# Patient Record
Sex: Female | Born: 1985 | Race: White | Hispanic: No | Marital: Married | Smoking: Never smoker
Health system: Southern US, Community
[De-identification: ages and names within clinical notes are randomized; demographics above are authoritative.]

## PROBLEM LIST (undated history)

## (undated) ENCOUNTER — Inpatient Hospital Stay (HOSPITAL_COMMUNITY): Payer: Self-pay

## (undated) DIAGNOSIS — Z789 Other specified health status: Secondary | ICD-10-CM

## (undated) HISTORY — PX: NO PAST SURGERIES: SHX2092

---

## 2013-06-12 ENCOUNTER — Inpatient Hospital Stay (HOSPITAL_COMMUNITY)
Admission: AD | Admit: 2013-06-12 | Discharge: 2013-06-12 | Disposition: A | Discharge status: Home / Self Care | Payer: 59 | Source: Ambulatory Visit | Attending: Family Medicine | Admitting: Family Medicine

## 2013-06-12 ENCOUNTER — Encounter (HOSPITAL_COMMUNITY): Payer: Self-pay | Admitting: *Deleted

## 2013-06-12 ENCOUNTER — Inpatient Hospital Stay (HOSPITAL_COMMUNITY): Payer: 59

## 2013-06-12 DIAGNOSIS — O200 Threatened abortion: Secondary | ICD-10-CM

## 2013-06-12 HISTORY — DX: Other specified health status: Z78.9

## 2013-06-12 LAB — URINALYSIS, ROUTINE W REFLEX MICROSCOPIC
Bilirubin Urine: NEGATIVE
Ketones, ur: NEGATIVE mg/dL
Nitrite: NEGATIVE
Protein, ur: NEGATIVE mg/dL
Specific Gravity, Urine: 1.02 (ref 1.005–1.030)
Urobilinogen, UA: 0.2 mg/dL (ref 0.0–1.0)

## 2013-06-12 LAB — ABO/RH: ABO/RH(D): O POS

## 2013-06-12 LAB — CBC
HCT: 38.5 % (ref 36.0–46.0)
MCH: 29.6 pg (ref 26.0–34.0)
MCHC: 34 g/dL (ref 30.0–36.0)
MCV: 86.9 fL (ref 78.0–100.0)
Platelets: 222 10*3/uL (ref 150–400)
RDW: 12.3 % (ref 11.5–15.5)

## 2013-06-12 LAB — WET PREP, GENITAL
Clue Cells Wet Prep HPF POC: NONE SEEN
Trich, Wet Prep: NONE SEEN

## 2013-06-12 NOTE — MAU Note (Signed)
Heavy bleeding x1 day. Pt states she has been having bleeding for about 6 days

## 2013-06-12 NOTE — MAU Provider Note (Signed)
History     CSN: [id number]  Arrival date and time: 06/12/13 1711   None     Chief Complaint  Patient presents with  . Vaginal Bleeding   HPI  Meghan Mcmahon is a 27 y.o. G1P1001 who presents today with vaginal bleeding. She states that she took a home pregnancy test that was positive, and then started bleeding. She states that she has been spotting for a couple of weeks, but it became heavy today. Her LMP 05/06/13 and she had a positive pregnancy test 06/04/13.   She has just moved to the area, and has not established care anywhere.   No past medical history on file.  No past surgical history on file.  No family history on file.  History  Substance Use Topics  . Smoking status: Not on file  . Smokeless tobacco: Not on file  . Alcohol Use: Not on file    Allergies: No Known Allergies  Prescriptions prior to admission  Medication Sig Dispense Refill  . acetaminophen (TYLENOL) 500 MG tablet Take 1,000 mg by mouth every 6 (six) hours as needed for pain (stomach pain).        ROS Physical Exam   Blood pressure 132/52, pulse 124, temperature 98.8 F (37.1 C), temperature source Oral, resp. rate 18, height 5' (1.524 m), weight 46.539 kg (102 lb 9.6 oz).  Physical Exam  Nursing note and vitals reviewed. Constitutional: She is oriented to person, place, and time. She appears well-developed and well-nourished. No distress.  Cardiovascular: Normal rate.   Respiratory: Effort normal.  GI: Soft.  Genitourinary:   External: no lesion Vagina: moderate amount of blood and clot  Cervix: pink, smooth, no CMT Uterus: NSSC Adnexa: NT   Neurological: She is alert and oriented to person, place, and time.  Skin: Skin is warm and dry.  Psychiatric: She has a normal mood and affect.    MAU Course  Procedures  Results for orders placed during the hospital encounter of 06/12/13 (from the past 24 hour(s))  URINALYSIS, ROUTINE W REFLEX MICROSCOPIC     Status: Abnormal   Collection Time    06/12/13  5:30 PM      Result Value Range   Color, Urine YELLOW  YELLOW   APPearance CLEAR  CLEAR   Specific Gravity, Urine 1.020  1.005 - 1.030   pH 7.0  5.0 - 8.0   Glucose, UA NEGATIVE  NEGATIVE mg/dL   Hgb urine dipstick LARGE (*) NEGATIVE   Bilirubin Urine NEGATIVE  NEGATIVE   Ketones, ur NEGATIVE  NEGATIVE mg/dL   Protein, ur NEGATIVE  NEGATIVE mg/dL   Urobilinogen, UA 0.2  0.0 - 1.0 mg/dL   Nitrite NEGATIVE  NEGATIVE   Leukocytes, UA NEGATIVE  NEGATIVE  URINE MICROSCOPIC-ADD ON     Status: None   Collection Time    06/12/13  5:30 PM      Result Value Range   Squamous Epithelial / LPF RARE  RARE   RBC / HPF 3-6  <3 RBC/hpf   Bacteria, UA RARE  RARE  POCT PREGNANCY, URINE     Status: Abnormal   Collection Time    06/12/13  5:41 PM      Result Value Range   Preg Test, Ur POSITIVE (*) NEGATIVE  CBC     Status: None   Collection Time    06/12/13  5:44 PM      Result Value Range   WBC 8.7  4.0 - 10.5 K/uL   RBC  4.43  3.87 - 5.11 MIL/uL   Hemoglobin 13.1  12.0 - 15.0 g/dL   HCT 45.4  09.8 - 11.9 %   MCV 86.9  78.0 - 100.0 fL   MCH 29.6  26.0 - 34.0 pg   MCHC 34.0  30.0 - 36.0 g/dL   RDW 14.7  82.9 - 56.2 %   Platelets 222  150 - 400 K/uL  ABO/RH     Status: None   Collection Time    06/12/13  5:44 PM      Result Value Range   ABO/RH(D) O POS    HCG, QUANTITATIVE, PREGNANCY     Status: Abnormal   Collection Time    06/12/13  5:44 PM      Result Value Range   hCG, Beta Chain, Quant, S 2334 (*) <5 mIU/mL    US Ob Comp Less 14 Wks  06/12/2013   CLINICAL DATA:  Cramping, pregnant.  EXAM: OBSTETRIC <14 WK [place] AND TRANSVAGINAL OB US  TECHNIQUE: Both transabdominal and transvaginal ultrasound examinations were performed for complete evaluation of the gestation as well as the maternal uterus, adnexal regions, and pelvic cul-de-sac. Transvaginal technique was performed to assess early pregnancy.  COMPARISON:  None.  FINDINGS: Intrauterine gestational sac:  None seen  Yolk sac:  None seen  Embryo:  None seen  Maternal uterus/adnexae: Left ovary 18 x 34 mm, right 19 x 30 mm. No free fluid. Uterus unremarkable. Heterogeneous endometrium.  IMPRESSION: No evidence of intrauterine or ectopic gestation.   Electronically Signed   By: Oley Balm M.D.   On: 06/12/2013 18:17   US Ob Transvaginal  06/12/2013   CLINICAL DATA:  Cramping, pregnant.  EXAM: OBSTETRIC <14 WK [place] AND TRANSVAGINAL OB US  TECHNIQUE: Both transabdominal and transvaginal ultrasound examinations were performed for complete evaluation of the gestation as well as the maternal uterus, adnexal regions, and pelvic cul-de-sac. Transvaginal technique was performed to assess early pregnancy.  COMPARISON:  None.  FINDINGS: Intrauterine gestational sac: None seen  Yolk sac:  None seen  Embryo:  None seen  Maternal uterus/adnexae: Left ovary 18 x 34 mm, right 19 x 30 mm. No free fluid. Uterus unremarkable. Heterogeneous endometrium.  IMPRESSION: No evidence of intrauterine or ectopic gestation.   Electronically Signed   By: Oley Balm M.D.   On: 06/12/2013 18:17     Assessment and Plan   1. Threatened abortion in first trimester    Bleeding precautions FU in 48 hours for repeat hcg Return to MAU sooner if needed Declines pain medication today   Meghan Mcmahon 06/12/2013, 6:41 PM

## 2013-06-12 NOTE — MAU Provider Note (Signed)
Chart reviewed and agree with management and plan.  

## 2013-06-12 NOTE — MAU Note (Signed)
Pt stated she had a positive pregnancy test on 06/02/13. Started having vaginal bleeding on 10/22 had light bleeding. Started bleeding heavy today with some clots and cramping.

## 2013-06-13 LAB — GC/CHLAMYDIA PROBE AMP
CT Probe RNA: NEGATIVE
GC Probe RNA: NEGATIVE

## 2013-06-14 ENCOUNTER — Encounter (HOSPITAL_COMMUNITY): Payer: Self-pay

## 2013-06-14 ENCOUNTER — Inpatient Hospital Stay (HOSPITAL_COMMUNITY)
Admission: AD | Admit: 2013-06-14 | Discharge: 2013-06-14 | Disposition: A | Discharge status: Home / Self Care | Payer: 59 | Source: Ambulatory Visit | Attending: Obstetrics and Gynecology | Admitting: Obstetrics and Gynecology

## 2013-06-14 DIAGNOSIS — O200 Threatened abortion: Secondary | ICD-10-CM | POA: Insufficient documentation

## 2013-06-14 DIAGNOSIS — O039 Complete or unspecified spontaneous abortion without complication: Secondary | ICD-10-CM

## 2013-06-14 NOTE — MAU Provider Note (Signed)
History     CSN: [id number]  Arrival date and time: 06/14/13 4098   First Provider Initiated Contact with Patient 06/14/13 1847      Chief Complaint  Patient presents with  . Follow up BHCG    HPI Meghan Mcmahon 27 y.o. [redacted]w[redacted]d  Was seen in MAU on 06-12-13 with vaginal bleeding and quant was 2300.  Client suspected she was having a miscarriage but was bleeding more than she had expected.  Today is bleeding a smaller amount.  Sharene Butters is pending.  OB History   Grav Para Term Preterm Abortions TAB SAB Ect Mult Living   3               Past Medical History  Diagnosis Date  . Medical history non-contributory     Past Surgical History  Procedure Laterality Date  . No past surgeries      Family History  Problem Relation Age of Onset  . Alcohol abuse Neg Hx   . Arthritis Neg Hx   . Asthma Neg Hx   . Birth defects Neg Hx   . Cancer Neg Hx   . COPD Neg Hx   . Depression Neg Hx   . Diabetes Neg Hx   . Drug abuse Neg Hx   . Early death Neg Hx   . Hearing loss Neg Hx   . Heart disease Neg Hx   . Hyperlipidemia Neg Hx   . Hypertension Neg Hx   . Kidney disease Neg Hx   . Learning disabilities Neg Hx   . Mental illness Neg Hx   . Mental retardation Neg Hx   . Miscarriages / Stillbirths Neg Hx   . Stroke Neg Hx   . Vision loss Neg Hx   . Varicose Veins Neg Hx     History  Substance Use Topics  . Smoking status: Not on file  . Smokeless tobacco: Not on file  . Alcohol Use: Not on file    Allergies: No Known Allergies  Prescriptions prior to admission  Medication Sig Dispense Refill  . acetaminophen (TYLENOL) 500 MG tablet Take 1,000 mg by mouth every 6 (six) hours as needed for pain (stomach pain).        Review of Systems  Constitutional: Negative for fever.  Gastrointestinal: Negative for nausea, vomiting and abdominal pain.  Genitourinary:       Vaginal bleeding   Physical Exam   Blood pressure 114/58, pulse 78, temperature 98.4 F (36.9 C), temperature  source Oral, resp. rate 18, height 5' (1.524 m), weight 104 lb 8 oz (47.401 kg), last menstrual period 05/05/2013.  Physical Exam  Nursing note and vitals reviewed. Constitutional: She is oriented to person, place, and time. She appears well-developed and well-nourished. No distress.  HENT:  Head: Normocephalic.  Eyes: EOM are normal.  Neck: Neck supple.  Musculoskeletal: Normal range of motion.  Neurological: She is alert and oriented to person, place, and time.  Skin: Skin is warm and dry.  Psychiatric: She has a normal mood and affect.    MAU Course  Procedures  MDM Results for orders placed during the hospital encounter of 06/14/13 (from the past 24 hour(s))  HCG, QUANTITATIVE, PREGNANCY     Status: Abnormal   Collection Time    06/14/13  6:04 PM      Result Value Range   hCG, Beta Chain, Quant, S 490 (*) <5 mIU/mL     Assessment and Plan  Falling quants indicating spontaneous miscarriage  Plan  Will send a message to the GYN clinic to draw a quant in 2 weeks.  ] Discussed plan with client and she is in agreement. Information given on miscarriage and on pregnancy loss.  BURLESON,TERRI 06/14/2013, 6:47 PM

## 2013-06-14 NOTE — MAU Note (Signed)
Pt states here for f/u BHCG only. Denies pain. Is still bleeding however is bleeding smaller amount. Changing pad every 2-3 hours. Does still note small nickel sized clots today.

## 2013-06-15 NOTE — MAU Provider Note (Signed)
Attestation of Attending Supervision of Advanced Practitioner (CNM/NP): Evaluation and management procedures were performed by the Advanced Practitioner under my supervision and collaboration.  I have reviewed the Advanced Practitioner's note and chart, and I agree with the management and plan.  Amay Mijangos 06/15/2013 3:54 PM

## 2013-06-30 ENCOUNTER — Encounter: Payer: 59 | Admitting: Medical

## 2013-08-14 NOTE — L&D Delivery Note (Signed)
Delivery Note At 1:04 AM a viable female was delivered via  (Presentation: ; DOA ).  APGAR: 8, 9; weight .   Placenta status: Intact, Spontaneous, marginal cord insertino.  Cord: 3 vessels with the following complications: None.  Cord pH: not indicated  Anesthesia: Epidural  Episiotomy: None Lacerations: 2nd degree;Perineal;Periurethral Suture Repair: 2.0 3.0 vicryl rapide standard repair w/ 3-0 vicryl, deep layer of perineal sutures with 2-0 vicryl due broad splaying of tissue, rectal exam done- no 3rd or 4th degreee extension, 2 figure of 8 sutures with 4-0 vicryl while foley in place to bleeding periurethral laceration, additional figure of 8's with 4-0 vicryl to R labial tear and to help close the perineal laceration. hemostatic and well approximated at the end of the repair Est. Blood Loss (mL): 350  Mom to postpartum.  Baby to Couplet care / Skin to Skin.  Nithin Demeo A. 05/07/2014, 1:39 AM

## 2013-11-21 LAB — OB RESULTS CONSOLE HIV ANTIBODY (ROUTINE TESTING): HIV: NONREACTIVE

## 2013-11-21 LAB — OB RESULTS CONSOLE HEPATITIS B SURFACE ANTIGEN: Hepatitis B Surface Ag: NEGATIVE

## 2013-11-21 LAB — OB RESULTS CONSOLE GC/CHLAMYDIA
Chlamydia: NEGATIVE
Gonorrhea: NEGATIVE

## 2013-11-21 LAB — OB RESULTS CONSOLE ABO/RH: RH TYPE: POSITIVE

## 2013-11-21 LAB — OB RESULTS CONSOLE ANTIBODY SCREEN: ANTIBODY SCREEN: NEGATIVE

## 2013-11-21 LAB — OB RESULTS CONSOLE RUBELLA ANTIBODY, IGM: Rubella: IMMUNE

## 2014-02-11 LAB — OB RESULTS CONSOLE RPR: RPR: NONREACTIVE

## 2014-03-31 LAB — OB RESULTS CONSOLE GBS: STREP GROUP B AG: NEGATIVE

## 2014-04-19 ENCOUNTER — Encounter (HOSPITAL_COMMUNITY): Payer: Self-pay | Admitting: *Deleted

## 2014-05-06 ENCOUNTER — Inpatient Hospital Stay (HOSPITAL_COMMUNITY)
Admission: AD | Admit: 2014-05-06 | Discharge: 2014-05-09 | DRG: 775 | Disposition: A | Discharge status: Home / Self Care | Payer: 59 | Source: Ambulatory Visit | Attending: Obstetrics | Admitting: Obstetrics

## 2014-05-06 ENCOUNTER — Inpatient Hospital Stay (HOSPITAL_COMMUNITY): Payer: 59 | Admitting: Anesthesiology

## 2014-05-06 ENCOUNTER — Encounter (HOSPITAL_COMMUNITY): Payer: Self-pay | Admitting: *Deleted

## 2014-05-06 ENCOUNTER — Encounter (HOSPITAL_COMMUNITY): Payer: 59 | Admitting: Anesthesiology

## 2014-05-06 DIAGNOSIS — O480 Post-term pregnancy: Secondary | ICD-10-CM | POA: Diagnosis present

## 2014-05-06 DIAGNOSIS — D696 Thrombocytopenia, unspecified: Secondary | ICD-10-CM | POA: Diagnosis present

## 2014-05-06 DIAGNOSIS — O409XX0 Polyhydramnios, unspecified trimester, not applicable or unspecified: Secondary | ICD-10-CM | POA: Diagnosis present

## 2014-05-06 DIAGNOSIS — D689 Coagulation defect, unspecified: Secondary | ICD-10-CM | POA: Diagnosis present

## 2014-05-06 DIAGNOSIS — O9912 Other diseases of the blood and blood-forming organs and certain disorders involving the immune mechanism complicating childbirth: Secondary | ICD-10-CM

## 2014-05-06 DIAGNOSIS — Z3490 Encounter for supervision of normal pregnancy, unspecified, unspecified trimester: Secondary | ICD-10-CM

## 2014-05-06 LAB — TYPE AND SCREEN
ABO/RH(D): O POS
ANTIBODY SCREEN: NEGATIVE

## 2014-05-06 LAB — CBC
HCT: 42.7 % (ref 36.0–46.0)
Hemoglobin: 14.5 g/dL (ref 12.0–15.0)
MCH: 31 pg (ref 26.0–34.0)
MCHC: 34 g/dL (ref 30.0–36.0)
MCV: 91.2 fL (ref 78.0–100.0)
Platelets: 135 10*3/uL — ABNORMAL LOW (ref 150–400)
RBC: 4.68 MIL/uL (ref 3.87–5.11)
RDW: 13.7 % (ref 11.5–15.5)
WBC: 11.1 10*3/uL — AB (ref 4.0–10.5)

## 2014-05-06 LAB — RPR

## 2014-05-06 MED ORDER — EPHEDRINE 5 MG/ML INJ
10.0000 mg | INTRAVENOUS | Status: DC | PRN
  Filled 2014-05-06: qty 2

## 2014-05-06 MED ORDER — ONDANSETRON HCL 4 MG/2ML IJ SOLN: 4.0000 mg | Freq: Four times a day (QID) | INTRAMUSCULAR | Status: DC | PRN

## 2014-05-06 MED ORDER — OXYTOCIN 40 UNITS IN LACTATED RINGERS INFUSION - SIMPLE MED
1.0000 m[IU]/min | INTRAVENOUS | Status: DC
  Administered 2014-05-06: 2 m[IU]/min via INTRAVENOUS
  Filled 2014-05-06: qty 1000

## 2014-05-06 MED ORDER — OXYTOCIN 40 UNITS IN LACTATED RINGERS INFUSION - SIMPLE MED: 62.5000 mL/h | INTRAVENOUS | Status: DC

## 2014-05-06 MED ORDER — LIDOCAINE HCL (PF) 1 % IJ SOLN
30.0000 mL | INTRAMUSCULAR | Status: DC | PRN
  Filled 2014-05-06: qty 30

## 2014-05-06 MED ORDER — CITRIC ACID-SODIUM CITRATE 334-500 MG/5ML PO SOLN: 30.0000 mL | ORAL | Status: DC | PRN

## 2014-05-06 MED ORDER — PHENYLEPHRINE 40 MCG/ML (10ML) SYRINGE FOR IV PUSH (FOR BLOOD PRESSURE SUPPORT)
80.0000 ug | PREFILLED_SYRINGE | INTRAVENOUS | Status: DC | PRN
  Filled 2014-05-06: qty 2

## 2014-05-06 MED ORDER — OXYCODONE-ACETAMINOPHEN 5-325 MG PO TABS: 2.0000 | ORAL_TABLET | ORAL | Status: DC | PRN

## 2014-05-06 MED ORDER — LACTATED RINGERS IV SOLN
INTRAVENOUS | Status: DC
  Administered 2014-05-06 (×2): via INTRAVENOUS

## 2014-05-06 MED ORDER — TERBUTALINE SULFATE 1 MG/ML IJ SOLN: 0.2500 mg | Freq: Once | INTRAMUSCULAR | Status: AC | PRN

## 2014-05-06 MED ORDER — LACTATED RINGERS IV SOLN: 500.0000 mL | INTRAVENOUS | Status: DC | PRN

## 2014-05-06 MED ORDER — ACETAMINOPHEN 325 MG PO TABS: 650.0000 mg | ORAL_TABLET | ORAL | Status: DC | PRN

## 2014-05-06 MED ORDER — DIPHENHYDRAMINE HCL 50 MG/ML IJ SOLN: 12.5000 mg | INTRAMUSCULAR | Status: DC | PRN

## 2014-05-06 MED ORDER — OXYCODONE-ACETAMINOPHEN 5-325 MG PO TABS: 1.0000 | ORAL_TABLET | ORAL | Status: DC | PRN

## 2014-05-06 MED ORDER — PHENYLEPHRINE 40 MCG/ML (10ML) SYRINGE FOR IV PUSH (FOR BLOOD PRESSURE SUPPORT)
80.0000 ug | PREFILLED_SYRINGE | INTRAVENOUS | Status: DC | PRN
  Filled 2014-05-06: qty 10
  Filled 2014-05-06: qty 2

## 2014-05-06 MED ORDER — LIDOCAINE HCL (PF) 1 % IJ SOLN
INTRAMUSCULAR | Status: DC | PRN
  Administered 2014-05-06 (×2): 5 mL

## 2014-05-06 MED ORDER — LACTATED RINGERS IV SOLN: 500.0000 mL | Freq: Once | INTRAVENOUS | Status: DC

## 2014-05-06 MED ORDER — OXYTOCIN BOLUS FROM INFUSION: 500.0000 mL | INTRAVENOUS | Status: DC

## 2014-05-06 MED ORDER — FENTANYL 2.5 MCG/ML BUPIVACAINE 1/10 % EPIDURAL INFUSION (WH - ANES)
14.0000 mL/h | INTRAMUSCULAR | Status: DC | PRN
  Administered 2014-05-06 (×2): 14 mL/h via EPIDURAL
  Filled 2014-05-06: qty 125

## 2014-05-06 NOTE — Progress Notes (Signed)
Meghan Mcmahon is a 28 y.o. G4P0 at [redacted]w[redacted]d presenting for IOL due to polyhydramnios. Pt notes rare contractions. Good fetal movement, No vaginal bleeding, not leaking fluid.  PNCare at [facility] since 1st trimester - Dated by LMP c/w 14 wk u/s - h/o VAVD due to fetal bradycardia and nuchal cord - post-dates testing revealed polyhydramnios with echogenic fluid (? Meconium) - u/s 9/23: 8'5, 69%, AFi 21/ 99%   Prenatal Transfer Tool  Maternal Diabetes: No Genetic Screening: Declined Maternal Ultrasounds/Referrals: Normal Fetal Ultrasounds or other Referrals:  None Maternal Substance Abuse:  No Significant Maternal Medications:  None Significant Maternal Lab Results: None     OB History   Grav Para Term Preterm Abortions TAB SAB Ect Mult Living   4              Past Medical History  Diagnosis Date  . Medical history non-contributory    Past Surgical History  Procedure Laterality Date  . No past surgeries     Family History: family history is negative for Alcohol abuse, Arthritis, Asthma, Birth defects, Cancer, COPD, Depression, Diabetes, Drug abuse, Early death, Hearing loss, Heart disease, Hyperlipidemia, Hypertension, Kidney disease, Learning disabilities, Mental illness, Mental retardation, Miscarriages / Stillbirths, Stroke, Vision loss, and Varicose Veins. Social History:  reports that she has never smoked. She has never used smokeless tobacco. She reports that she does not drink alcohol or use illicit drugs.  Review of Systems - Negative except discomfort of pregnancy   Dilation: 2 Effacement (%): 20 Station: -2 Exam by:: Dr. Ernestina Mcmahon Height 5' (1.524 m), weight 65.772 kg (145 lb), last menstrual period 05/05/2013, unknown if currently breastfeeding. Nuchal hand present  Physical Exam:  Gen: well appearing, no distress Back: no CVAT Abd: gravid, NT, no RUQ pain LE: no edema, equal bilaterally, non-tender Toco: rare FH: baseline 140s, accelerations present, no  deceleratons, 10 beat variability  Prenatal labs: ABO, Rh: --/--/O POS (10/30 1744) Antibody:  neg Rubella:  immune RPR:   NF HBsAg:   neg HIV:   neg GBS:   neg 1 hr Glucola 84  Genetic screening declined, nl AFP Anatomy US nl   Assessment/Plan: 28 y.o. G4P0 at [redacted]w[redacted]d Polyhydramnios. Echogenic fluid of unclear etiology. Recc move to delivery Plan pitocin until vtx better engaged then AROM Reactive fetal testing  Meghan Mcmahon A. 05/06/2014 2:34 PM

## 2014-05-06 NOTE — Anesthesia Procedure Notes (Signed)
Epidural Patient location during procedure: OB Start time: 05/06/2014 10:14 PM  Staffing Anesthesiologist: Brayton Caves Performed by: anesthesiologist   Preanesthetic Checklist Completed: patient identified, site marked, surgical consent, pre-op evaluation, timeout performed, IV checked, risks and benefits discussed and monitors and equipment checked  Epidural Patient position: sitting Prep: site prepped and draped and DuraPrep Patient monitoring: continuous pulse ox and blood pressure Approach: midline Location: L3-L4 Injection technique: LOR air  Needle:  Needle type: Tuohy  Needle gauge: 17 G Needle length: 9 cm and 9 Needle insertion depth: 5 cm cm Catheter type: closed end flexible Catheter size: 19 Gauge Catheter at skin depth: 10 cm Test dose: negative  Assessment Events: blood not aspirated, injection not painful, no injection resistance, negative IV test and no paresthesia  Additional Notes Patient identified.  Risk benefits discussed including failed block, incomplete pain control, headache, nerve damage, paralysis, blood pressure changes, nausea, vomiting, reactions to medication both toxic or allergic, and postpartum back pain.  Patient expressed understanding and wished to proceed.  All questions were answered.  Sterile technique used throughout procedure and epidural site dressed with sterile barrier dressing. No paresthesia or other complications noted.The patient did not experience any signs of intravascular injection such as tinnitus or metallic taste in mouth nor signs of intrathecal spread such as rapid motor block. Please see nursing notes for vital signs.

## 2014-05-06 NOTE — H&P (Signed)
Meghan Mcmahon is a 27 y.o. G4P0 at [redacted]w[redacted]d presenting for IOL due to polyhydramnios. Pt notes rare contractions. Good fetal movement, No vaginal bleeding, not leaking fluid.  PNCare at [facility] since 1st trimester - Dated by LMP c/w 14 wk u/s - h/o VAVD due to fetal bradycardia and nuchal cord - post-dates testing revealed polyhydramnios with echogenic fluid (? Meconium) - u/s 9/23: 8'5, 69%, AFi 21/ 99%   Prenatal Transfer Tool  Maternal Diabetes: No Genetic Screening: Declined Maternal Ultrasounds/Referrals: Normal Fetal Ultrasounds or other Referrals:  None Maternal Substance Abuse:  No Significant Maternal Medications:  None Significant Maternal Lab Results: None     OB History   Grav Para Term Preterm Abortions TAB SAB Ect Mult Living   4              Past Medical History  Diagnosis Date  . Medical history non-contributory    Past Surgical History  Procedure Laterality Date  . No past surgeries     Family History: family history is negative for Alcohol abuse, Arthritis, Asthma, Birth defects, Cancer, COPD, Depression, Diabetes, Drug abuse, Early death, Hearing loss, Heart disease, Hyperlipidemia, Hypertension, Kidney disease, Learning disabilities, Mental illness, Mental retardation, Miscarriages / Stillbirths, Stroke, Vision loss, and Varicose Veins. Social History:  reports that she has never smoked. She has never used smokeless tobacco. She reports that she does not drink alcohol or use illicit drugs.  Review of Systems - Negative except discomfort of pregnancy   Dilation: 2 Effacement (%): 20 Station: -2 Exam by:: Dr. Dorinne Graeff Height 5' (1.524 m), weight 65.772 kg (145 lb), last menstrual period 05/05/2013, unknown if currently breastfeeding. Nuchal hand present  Physical Exam:  Gen: well appearing, no distress Back: no CVAT Abd: gravid, NT, no RUQ pain LE: no edema, equal bilaterally, non-tender Toco: rare FH: baseline 140s, accelerations present, no  deceleratons, 10 beat variability  Prenatal labs: ABO, Rh: --/--/O POS (10/30 1744) Antibody:  neg Rubella:  immune RPR:   NF HBsAg:   neg HIV:   neg GBS:   neg 1 hr Glucola 84  Genetic screening declined, nl AFP Anatomy US nl   Assessment/Plan: 27 y.o. G4P0 at [redacted]w[redacted]d Polyhydramnios. Echogenic fluid of unclear etiology. Recc move to delivery Plan pitocin until vtx better engaged then AROM Reactive fetal testing  Lyndal Alamillo A. 05/06/2014 2:34 PM        

## 2014-05-06 NOTE — Progress Notes (Signed)
S: Doing well, no complaints, pain well controlled, not feeling rare contractions despite increasing pitocin.   O: BP 111/78  Pulse 83  Temp(Src) 97.9 F (36.6 C) (Oral)  Resp 18  Ht 5' (1.524 m)  Wt 65.772 kg (145 lb)  BMI 28.32 kg/m2  LMP 05/05/2013   FHT:  FHR: 130s bpm, variability: moderate,  accelerations:  Present,  decelerations:  Absent UC:   irregular, every 6 minutes SVE:   Dilation: 3 Effacement (%): 80 Station: -3 Exam by:: dr Ernestina Penna AROM- clear   A / P:  28 y.o.  Obstetric History   G3   P1   T1   P0   A1   TAB0   SAB1   E0   M0   L1    at [redacted]w[redacted]d Induction of labor due to polyhydramnios,  progressing well on pitocin Slow progress, expect more rapid change/ enter in active labor now that AROM  Fetal Wellbeing:  Category I Pain Control:  Labor support without medications  Anticipated MOD:  NSVD  Trysta Showman A. 05/06/2014, 6:15 PM

## 2014-05-06 NOTE — Anesthesia Preprocedure Evaluation (Signed)

## 2014-05-07 ENCOUNTER — Encounter (HOSPITAL_COMMUNITY): Payer: Self-pay | Admitting: *Deleted

## 2014-05-07 LAB — CBC
HEMATOCRIT: 36.5 % (ref 36.0–46.0)
HEMOGLOBIN: 12.5 g/dL (ref 12.0–15.0)
MCH: 30.7 pg (ref 26.0–34.0)
MCHC: 34.2 g/dL (ref 30.0–36.0)
MCV: 89.7 fL (ref 78.0–100.0)
Platelets: 113 10*3/uL — ABNORMAL LOW (ref 150–400)
RBC: 4.07 MIL/uL (ref 3.87–5.11)
RDW: 13.3 % (ref 11.5–15.5)
WBC: 17.5 10*3/uL — ABNORMAL HIGH (ref 4.0–10.5)

## 2014-05-07 MED ORDER — ZOLPIDEM TARTRATE 5 MG PO TABS: 5.0000 mg | ORAL_TABLET | Freq: Every evening | ORAL | Status: DC | PRN

## 2014-05-07 MED ORDER — OXYCODONE-ACETAMINOPHEN 5-325 MG PO TABS
1.0000 | ORAL_TABLET | ORAL | Status: DC | PRN
  Administered 2014-05-07 – 2014-05-09 (×5): 1 via ORAL
  Filled 2014-05-07 (×5): qty 1

## 2014-05-07 MED ORDER — PRENATAL MULTIVITAMIN CH
1.0000 | ORAL_TABLET | Freq: Every day | ORAL | Status: DC
  Administered 2014-05-07 – 2014-05-09 (×3): 1 via ORAL
  Filled 2014-05-07 (×3): qty 1

## 2014-05-07 MED ORDER — DIPHENHYDRAMINE HCL 25 MG PO CAPS: 25.0000 mg | ORAL_CAPSULE | Freq: Four times a day (QID) | ORAL | Status: DC | PRN

## 2014-05-07 MED ORDER — FLEET ENEMA 7-19 GM/118ML RE ENEM: 1.0000 | ENEMA | Freq: Every day | RECTAL | Status: DC | PRN

## 2014-05-07 MED ORDER — LANOLIN HYDROUS EX OINT: TOPICAL_OINTMENT | CUTANEOUS | Status: DC | PRN

## 2014-05-07 MED ORDER — BISACODYL 10 MG RE SUPP: 10.0000 mg | Freq: Every day | RECTAL | Status: DC | PRN

## 2014-05-07 MED ORDER — ONDANSETRON HCL 4 MG/2ML IJ SOLN
4.0000 mg | INTRAMUSCULAR | Status: DC | PRN
Start: 2014-05-07 — End: 2014-05-09

## 2014-05-07 MED ORDER — WITCH HAZEL-GLYCERIN EX PADS
1.0000 "application " | MEDICATED_PAD | CUTANEOUS | Status: DC | PRN
  Administered 2014-05-08: 1 via TOPICAL

## 2014-05-07 MED ORDER — SENNOSIDES-DOCUSATE SODIUM 8.6-50 MG PO TABS
2.0000 | ORAL_TABLET | ORAL | Status: DC
  Administered 2014-05-08 (×2): 2 via ORAL
  Filled 2014-05-07 (×2): qty 2

## 2014-05-07 MED ORDER — ONDANSETRON HCL 4 MG PO TABS: 4.0000 mg | ORAL_TABLET | ORAL | Status: DC | PRN

## 2014-05-07 MED ORDER — DIBUCAINE 1 % RE OINT: 1.0000 "application " | TOPICAL_OINTMENT | RECTAL | Status: DC | PRN

## 2014-05-07 MED ORDER — IBUPROFEN 600 MG PO TABS
600.0000 mg | ORAL_TABLET | Freq: Four times a day (QID) | ORAL | Status: DC
  Administered 2014-05-07 – 2014-05-09 (×10): 600 mg via ORAL
  Filled 2014-05-07 (×10): qty 1

## 2014-05-07 MED ORDER — BENZOCAINE-MENTHOL 20-0.5 % EX AERO
1.0000 | INHALATION_SPRAY | CUTANEOUS | Status: DC | PRN
Start: 2014-05-07 — End: 2014-05-09
  Administered 2014-05-07 – 2014-05-09 (×2): 1 via TOPICAL
  Filled 2014-05-07 (×2): qty 56

## 2014-05-07 MED ORDER — SIMETHICONE 80 MG PO CHEW
80.0000 mg | CHEWABLE_TABLET | ORAL | Status: DC | PRN
Start: 2014-05-07 — End: 2014-05-09

## 2014-05-07 MED ORDER — OXYCODONE-ACETAMINOPHEN 5-325 MG PO TABS: 2.0000 | ORAL_TABLET | ORAL | Status: DC | PRN

## 2014-05-07 MED ORDER — TETANUS-DIPHTH-ACELL PERTUSSIS 5-2.5-18.5 LF-MCG/0.5 IM SUSP: 0.5000 mL | Freq: Once | INTRAMUSCULAR | Status: DC

## 2014-05-07 NOTE — Anesthesia Postprocedure Evaluation (Signed)
Anesthesia Post Note  Patient: Meghan Mcmahon  Procedure(s) Performed: * No procedures listed *  Anesthesia type: Epidural  Patient location: Mother/Baby  Post pain: Pain level controlled  Post assessment: Post-op Vital signs reviewed  Last Vitals:  Filed Vitals:   05/07/14 0845  BP: 96/55  Pulse: 66  Temp: 36.6 C  Resp: 16    Post vital signs: Reviewed  Level of consciousness:alert  Complications: No apparent anesthesia complications

## 2014-05-07 NOTE — Progress Notes (Signed)
Patient ID: Madyson Lukach, female   DOB: 1985/09/16, 28 y.o.   MRN: [id number] PPD # 1 SVD  S:  Reports feeling tired, but well             Tolerating po/ No nausea or vomiting             Bleeding is light             Pain controlled with ibuprofen (OTC)             Up ad lib / ambulatory / voiding without difficulties    Newborn  Information for the patient's newborn:  Bettejane, Leavens [[id number]]  female  breast feeding  / Circumcision NO   O:  A & O x 3, in no apparent distress              VS:  Filed Vitals:   05/07/14 0330 05/07/14 0345 05/07/14 0445 05/07/14 0845  BP: 109/54 109/58 107/56 96/55  Pulse: 80 80 81 66  Temp:  98.5 F (36.9 C) 98.4 F (36.9 C) 97.8 F (36.6 C)  TempSrc:  Oral Oral Oral  Resp: Height:      Weight:      SpO2:  99% 98% 98%    LABS:  Recent Labs  05/06/14 1400 05/07/14 0530  WBC 11.1* 17.5*  HGB 14.5 12.5  HCT 42.7 36.5  PLT 135* 113*    Blood type: O POS (09/23 1400)  Rubella: Immune (04/10 0000)   I&O: I/O last 3 completed shifts: In: - Out: 700 [Urine:350; Blood:350]            Lungs: Clear and unlabored  Heart: regular rate and rhythm / no murmurs  Abdomen: soft, non-tender, non-distended              Fundus: firm, non-tender, U-1  Perineum: 2nd degree repair healing well - mild edema  Lochia: minimal  Extremities: no edema, no calf pain or tenderness, no Homans    A/P: PPD # 1  28 y.o., [id number]   Principal Problem:    Postpartum care following vaginal delivery (9/23)    Doing well - stable status  Routine post partum orders  Anticipate discharge tomorrow    Raelyn Mora, M, MSN, CNM 05/07/2014, 9:40 AM

## 2014-05-08 ENCOUNTER — Encounter (HOSPITAL_COMMUNITY): Payer: Self-pay | Admitting: *Deleted

## 2014-05-08 NOTE — Lactation Note (Signed)
This note was copied from the chart of Meghan Tami Blass. Lactation Consultation Note Called into rm. D/t painful shallow latches. Experienced BF mom has 28 yr old BF for 18 months. Mom feeding in cross cradle position, needed pillows for support babys head was pulling down on nipple. Noted nipple pointed when unlatched. Explained BF newborn different from older toddler. Suggested football hold for deeper latch. Positioned w/pillows, stated felt better. Baby has been spitty, had large yellow emesis after feeding. Discussed deep latch verses shallow and position options and how important support is to obtain deep latch to prevent nipple damage. Encouraged chin tug if needed to obtain deeper latch. Hearn swallows during feeding. Patient Name: Meghan Mcmahon Date: 05/08/2014 Reason for consult: Follow-up assessment;Difficult latch   Maternal Data    Feeding Feeding Type: Breast Fed Length of feed: 5 min  LATCH Score/Interventions Latch: Grasps breast easily, tongue down, lips flanged, rhythmical sucking. Intervention(s): Adjust position;Assist with latch;Breast massage;Breast compression  Audible Swallowing: A few with stimulation Intervention(s):  (breast massage)  Type of Nipple: Everted at rest and after stimulation  Comfort (Breast/Nipple): Soft / non-tender     Hold (Positioning): Assistance needed to correctly position infant at breast and maintain latch. Intervention(s): Support Pillows;Position options  LATCH Score: 8  Lactation Tools Discussed/Used     Consult Status Consult Status: Follow-up Date: 05/08/14 Follow-up type: In-patient    Charyl Dancer 05/08/2014, 3:47 AM

## 2014-05-08 NOTE — Progress Notes (Signed)
PPD #1- SVD  Subjective:   Reports feeling well, perineum sore Tolerating po/ No nausea or vomiting Bleeding is light Pain controlled with Motrin and Percocet Up ad lib / ambulatory / voiding without problems Newborn: breastfeeding  / Circumcision: not planning   Objective:   VS:  VS:  Filed Vitals:   05/07/14 0845 05/07/14 1600 05/07/14 1800 05/08/14 0523  BP: 96/55 109/66 98/59 96/54   Pulse: 66 88 81 78  Temp: 97.8 F (36.6 C) 97.6 F (36.4 C) 98.3 F (36.8 C) 97.9 F (36.6 C)  TempSrc: Oral Oral Oral Oral  Resp: Height:      Weight:      SpO2: 98% 99%      LABS:  Recent Labs  05/06/14 1400 05/07/14 0530  WBC 11.1* 17.5*  HGB 14.5 12.5  PLT 135* 113*   Blood type: --/--/O POS (09/23 1400) Rubella: Immune (04/10 0000)   I&O: Intake/Output     09/24 0701 - 09/25 0700 09/25 0701 - 09/26 0700   Urine (mL/kg/hr)     Blood     Total Output       Net              Physical Exam: Alert and oriented x3 Abdomen: soft, non-tender, non-distended  Fundus: firm, non-tender, U-1 Perineum: Well approximated, no significant erythema, or drainage; moderate edema, healing well. Lochia: small Extremities: No edema, no calf pain or tenderness    Assessment:  PPD #1G3P2012/ S/P:induced vaginal, 2nd degree laceration, periurethral laceration, labial laceration Gestational thrombocytopenia, delivered-stable  Doing well    Plan: Continue routine post partum orders Anticipate D/C home tomorrow   Donette Larry, N MSN, CNM 05/08/2014, 8:50 AM

## 2014-05-09 MED ORDER — IBUPROFEN 600 MG PO TABS: 600.0000 mg | ORAL_TABLET | Freq: Four times a day (QID) | ORAL | Status: AC

## 2014-05-09 MED ORDER — OXYCODONE-ACETAMINOPHEN 5-325 MG PO TABS: 1.0000 | ORAL_TABLET | ORAL | Status: DC | PRN

## 2014-05-09 NOTE — Progress Notes (Signed)
PPD # 2- SVD  Subjective:   Reports feeling well, perineum sore Tolerating po/ No nausea or vomiting Bleeding is light Pain controlled with Motrin and Percocet Up ad lib / ambulatory / voiding without problems Newborn: breastfeeding  / Circumcision: not planning   Objective:   VS:  VS:  Filed Vitals:   05/07/14 1800 05/08/14 0523 05/08/14 1832 05/09/14 0610  BP: 98/59 96/54 112/70 109/67  Pulse: 81 78 88 84  Temp: 98.3 F (36.8 C) 97.9 F (36.6 C) 98.5 F (36.9 C) 98.3 F (36.8 C)  TempSrc: Oral Oral Oral Oral  Resp:  Height:      Weight:      SpO2:        LABS:   Recent Labs  05/06/14 1400 05/07/14 0530  WBC 11.1* 17.5*  HGB 14.5 12.5  PLT 135* 113*   Blood type: --/--/O POS (09/23 1400) Rubella: Immune (04/10 0000)   I&O: Intake/Output   None     Physical Exam: Alert and oriented x3 Abdomen: soft, non-tender, non-distended  Fundus: firm, non-tender, U-1 Perineum: Well approximated, no significant erythema, or drainage; moderate edema, healing well. Lochia: small Extremities: No edema, no calf pain or tenderness    Assessment:  PPD #2 / [id number] / S/P:induced vaginal, 2nd degree laceration, periurethral laceration, labial laceration Gestational thrombocytopenia, delivered-stable   Doing well    Plan: Continue routine post partum orders D/C home today   Raelyn Mora, M MSN, CNM 05/09/2014, 8:17 AM

## 2014-05-09 NOTE — Discharge Instructions (Signed)
Breast Pumping Tips °If you are breastfeeding, there may be times when you cannot feed your baby directly. Returning to work or going on a trip are common examples. Pumping allows you to store breast milk and feed it to your baby later.  °You may not get much milk when you first start to pump. Your breasts should start to make more after a few days. If you pump at the times you usually feed your baby, you may be able to keep making enough milk to feed your baby without also using formula. The more often you pump, the more milk you will produce.  °WHEN SHOULD I PUMP?  °· You can begin to pump soon after delivery. However, some experts recommend waiting about 4 weeks before giving your infant a bottle to make sure breastfeeding is going well.  °· If you plan to return to work, begin pumping a few weeks before. This will help you develop techniques that work best for you. It also lets you build up a supply of breast milk.   °· When you are with your infant, feed on demand and pump after each feeding.   °· When you are away from your infant for several hours, pump for about 15 minutes every 2-3 hours. Pump both breasts at the same time if you can.   °· If your infant has a formula feeding, make sure to pump around the same time.     °· If you drink any alcohol, wait 2 hours before pumping.   °HOW DO I PREPARE TO PUMP? °Your let-down reflex is the natural reaction to stimulation that makes your breast milk flow. It is easier to stimulate this reflex when you are relaxed. Find relaxation techniques that work for you. If you have difficulty with your let-down reflex, try these methods:  °· Smell one of your infant's blankets or an item of clothing.   °· Look at a picture or video of your infant.   °· Sit in a quiet, private space.   °· Massage the breast you plan to pump.   °· Place soothing warmth on the breast.   °· Play relaxing music.   °WHAT ARE SOME GENERAL BREAST PUMPING TIPS? °· Wash your hands before you pump. You  do not need to wash your nipples or breasts. °· There are three ways to pump. °¨ You can use your hand to massage and compress your breast. °¨ You can use a handheld manual pump. °¨ You can use an electric pump.   °· Make sure the suction cup (flange) on the breast pump is the right size. Place the flange directly over the nipple. If it is the wrong size or placed the wrong way, it may be painful and cause nipple damage.   °· If pumping is uncomfortable, apply a small amount of purified or modified lanolin to your nipple and areola. °· If you are using an electric pump, adjust the speed and suction power to be more comfortable. °· If pumping is painful or if you are not getting very much milk, you may need a different type of pump. A lactation consultant can help you determine what type of pump to use.   °· Keep a full water bottle near you at all times. Drinking lots of fluid helps you make more milk.  °· You can store your milk to use later. Pumped breast milk can be stored in a sealable, sterile container or plastic bag. Label all stored breast milk with the date you pumped it. °¨ Milk can stay out at room temperature for up to 8 hours. °¨   You can store your milk in the refrigerator for up to 8 days. °¨ You can store your milk in the freezer for 3 months. Thaw frozen milk using warm water. Do not put it in the microwave. °· Do not smoke. Smoking can lower your milk supply and harm your infant. If you need help quitting, ask your health care provider to recommend a program.   °WHEN SHOULD I CALL MY HEALTH CARE PROVIDER OR A LACTATION CONSULTANT? °· You are having trouble pumping. °· You are concerned that you are not making enough milk. °· You have nipple pain, soreness, or redness. °· You want to use birth control. Birth control pills may lower your milk supply. Talk to your health care provider about your options. °Document Released: 01/18/2010 Document Revised: 08/05/2013 Document Reviewed:  05/23/2013 °Exit[employer]® Patient Information ©2015 [employer]. This information is not intended to replace advice given to you by your health care provider. Make sure you discuss any questions you have with your health care provider. ° °Nutrition for the New Mother  °A new mother needs good health and nutrition so she can have energy to take care of a new baby. Whether a mother breastfeeds or formula feeds the baby, it is important to have a well-balanced diet. Foods from all the food groups should be chosen to meet the new mother's energy needs and to give her the nutrients needed for repair and healing.  °A HEALTHY EATING PLAN °The My [employer] plan for Moms outlines what you should eat to help you and your baby stay healthy. The energy and amount of food you need depends on whether or not you are breastfeeding. If you are breastfeeding you will need more nutrients. If you choose not to breastfeed, your nutrition goal should be to return to a healthy weight. Limiting calories may be needed if you are not breastfeeding.  °HOME CARE INSTRUCTIONS  °· For a personal plan based on your unique needs, see your Registered Dietitian or visit www.mypyramid.gov. °· Eat a variety of foods. The plan below will help guide you. The following chart has a suggested daily meal plan from the [employer] for Moms. °· Eat a variety of fruits and vegetables. °· Eat more dark green and orange vegetables and cooked dried beans. °· Make half your grains whole grains. Choose whole instead of refined grains. °· Choose low-fat or lean meats and poultry. °· Choose low-fat or fat-free dairy products like milk, cheese, or yogurt. °Fruits °· Breastfeeding: 2 cups °· Non-Breastfeeding: 2 cups °· What Counts as a serving? °¨ 1 cup of fruit or juice. °¨ ½ cup dried fruit. °Vegetables °· Breastfeeding: 3 cups °· Non-Breastfeeding: 2 ½ cups °· What Counts as a serving? °¨ 1 cup raw or cooked vegetables. °¨ Juice or 2 cups raw leafy  vegetables. °Grains °· Breastfeeding: 8 oz °· Non-Breastfeeding: 6 oz °· What Counts as a serving? °¨ 1 slice bread. °¨ 1 oz ready-to-eat cereal. °¨ ½ cup cooked pasta, rice, or cereal. °Meat and Beans °· Breastfeeding: 6 ½ oz °· Non-Breastfeeding: 5 ½ oz °· What Counts as a serving? °¨ 1 oz lean meat, poultry, or fish °¨ ¼ cup cooked dry beans °¨ ½ oz nuts or 1 egg °¨ 1 tbs peanut butter °Milk °· Breastfeeding: 3 cups °· Non-Breastfeeding: 3 cups °· What Counts as a serving? °¨ 1 cup milk. °¨ 8 oz yogurt. °¨ 1 ½ oz cheese. °¨ 2 oz processed cheese. °TIPS FOR THE BREASTFEEDING MOM °· Rapid weight   loss is not suggested when you are breastfeeding. By simply breastfeeding, you will be able to lose the weight gained during your pregnancy. Your caregiver can keep track of your weight and tell you if your weight loss is appropriate. °· Be sure to drink fluids. You may notice that you are thirstier than usual. A suggestion is to drink a glass of water or other beverage whenever you breastfeed. °· Avoid alcohol as it can be passed into your breast milk. °· Limit caffeine drinks to no more than 2 to 3 cups per day. °· You may need to keep taking your prenatal vitamin while you are breastfeeding. Talk with your caregiver about taking a vitamin or supplement. °RETURING TO A HEALTHY WEIGHT °· The My [employer] Plan for Moms will help you return to a healthy weight. It will also provide the nutrients you need. °· You may need to limit "empty" calories. These include: °¨ High fat foods like fried foods, fatty meats, fast food, butter, and mayonnaise. °¨ High sugar foods like sodas, jelly, candy, and sweets. °· Be physically active. Include 30 minutes of exercise or more each day. Choose an activity you like such as walking, swimming, biking, or aerobics. Check with your caregiver before you start to exercise. °Document Released: 11/07/2007 Document Revised: 10/23/2011 Document Reviewed: 11/07/2007 °ExitCare® Patient Information  ©2015 [employer]. This information is not intended to replace advice given to you by your health care provider. Make sure you discuss any questions you have with your health care provider. °Postpartum Depression and Baby Blues °The postpartum period begins right after the birth of a baby. During this time, there is often a great amount of joy and excitement. It is also a time of many changes in the life of the parents. Regardless of how many times a mother gives birth, each child brings new challenges and dynamics to the family. It is not unusual to have feelings of excitement along with confusing shifts in moods, emotions, and thoughts. All mothers are at risk of developing postpartum depression or the "baby blues." These mood changes can occur right after giving birth, or they may occur many months after giving birth. The baby blues or postpartum depression can be mild or severe. Additionally, postpartum depression can go away rather quickly, or it can be a long-term condition.  °CAUSES °Raised hormone levels and the rapid drop in those levels are thought to be a main cause of postpartum depression and the baby blues. A number of hormones change during and after pregnancy. Estrogen and progesterone usually decrease right after the delivery of your baby. The levels of thyroid hormone and various cortisol steroids also rapidly drop. Other factors that play a role in these mood changes include major life events and genetics.  °RISK FACTORS °If you have any of the following risks for the baby blues or postpartum depression, know what symptoms to watch out for during the postpartum period. Risk factors that may increase the likelihood of getting the baby blues or postpartum depression include: °· Having a personal or family history of depression.   °· Having depression while being pregnant.   °· Having premenstrual mood issues or mood issues related to oral contraceptives. °· Having a lot of life stress.   °· Having  marital conflict.   °· Lacking a social support network.   °· Having a baby with special needs.   °· Having health problems, such as diabetes.   °SIGNS AND SYMPTOMS °Symptoms of baby blues include: °· Brief changes in mood, such as going   from extreme happiness to sadness. °· Decreased concentration.   °· Difficulty sleeping.   °· Crying spells, tearfulness.   °· Irritability.   °· Anxiety.   °Symptoms of postpartum depression typically begin within the first month after giving birth. These symptoms include: °· Difficulty sleeping or excessive sleepiness.   °· Marked weight loss.   °· Agitation.   °· Feelings of worthlessness.   °· Lack of interest in activity or food.   °Postpartum psychosis is a very serious condition and can be dangerous. Fortunately, it is rare. Displaying any of the following symptoms is cause for immediate medical attention. Symptoms of postpartum psychosis include:  °· Hallucinations and delusions.   °· Bizarre or disorganized behavior.   °· Confusion or disorientation.   °DIAGNOSIS  °A diagnosis is made by an evaluation of your symptoms. There are no medical or lab tests that lead to a diagnosis, but there are various questionnaires that a health care provider may use to identify those with the baby blues, postpartum depression, or psychosis. Often, a screening tool called the Edinburgh Postnatal Depression Scale is used to diagnose depression in the postpartum period.  °TREATMENT °The baby blues usually goes away on its own in 1-2 weeks. Social support is often all that is needed. You will be encouraged to get adequate sleep and rest. Occasionally, you may be given medicines to help you sleep.  °Postpartum depression requires treatment because it can last several months or longer if it is not treated. Treatment may include individual or group therapy, medicine, or both to address any social, physiological, and psychological factors that may play a role in the depression. Regular exercise, a  healthy diet, rest, and social support may also be strongly recommended.  °Postpartum psychosis is more serious and needs treatment right away. Hospitalization is often needed. °HOME CARE INSTRUCTIONS °· Get as much rest as you can. Nap when the baby sleeps.   °· Exercise regularly. Some women find yoga and walking to be beneficial.   °· Eat a balanced and nourishing diet.   °· Do little things that you enjoy. Have a cup of tea, take a bubble bath, read your favorite magazine, or listen to your favorite music. °· Avoid alcohol.   °· Ask for help with household chores, cooking, grocery shopping, or running errands as needed. Do not try to do everything.   °· Talk to people close to you about how you are feeling. Get support from your partner, family members, friends, or other new moms. °· Try to stay positive in how you think. Think about the things you are grateful for.   °· Do not spend a lot of time alone.   °· Only take over-the-counter or prescription medicine as directed by your health care provider. °· Keep all your postpartum appointments.   °· Let your health care provider know if you have any concerns.   °SEEK MEDICAL CARE IF: °You are having a reaction to or problems with your medicine. °SEEK IMMEDIATE MEDICAL CARE IF: °· You have suicidal feelings.   °· You think you may harm the baby or someone else. °MAKE SURE YOU: °· Understand these instructions. °· Will watch your condition. °· Will get help right away if you are not doing well or get worse. °Document Released: 05/04/2004 Document Revised: 08/05/2013 Document Reviewed: 05/12/2013 °Exit[employer]® Patient Information ©2015 [employer]. This information is not intended to replace advice given to you by your health care provider. Make sure you discuss any questions you have with your health care provider. °Breastfeeding and Mastitis °Mastitis is inflammation of the breast tissue. It can occur in women who   are breastfeeding. This can make breastfeeding  painful. Mastitis will sometimes go away on its own. Your health care provider will help determine if treatment is needed. °CAUSES °Mastitis is often associated with a blocked milk (lactiferous) duct. This can happen when too much milk builds up in the breast. Causes of excess milk in the breast can include: °· Poor latch-on. If your baby is not latched onto the breast properly, she or he may not empty your breast completely while breastfeeding. °· Allowing too much time to pass between feedings. °· Wearing a bra or other clothing that is too tight. This puts extra pressure on the lactiferous ducts so milk does not flow through them as it should. °Mastitis can also be caused by a bacterial infection. Bacteria may enter the breast tissue through cuts or openings in the skin. In women who are breastfeeding, this may occur because of cracked or irritated skin. Cracks in the skin are often caused when your baby does not latch on properly to the breast. °SIGNS AND SYMPTOMS °· Swelling, redness, tenderness, and pain in an area of the breast. °· Swelling of the glands under the arm on the same side. °· Fever may or may not accompany mastitis. °If an infection is allowed to progress, a collection of pus (abscess) may develop. °DIAGNOSIS  °Your health care provider can usually diagnose mastitis based on your symptoms and a physical exam. Tests may be done to help confirm the diagnosis. These may include: °· Removal of pus from the breast by applying pressure to the area. This pus can be examined in the lab to determine which bacteria are present. If an abscess has developed, the fluid in the abscess can be removed with a needle. This can also be used to confirm the diagnosis and determine the bacteria present. In most cases, pus will not be present. °· Blood tests to determine if your body is fighting a bacterial infection. °· Mammogram or ultrasound tests to rule out other problems or diseases. °TREATMENT  °Mastitis that  occurs with breastfeeding will sometimes go away on its own. Your health care provider may choose to wait 24 hours after first seeing you to decide whether a prescription medicine is needed. If your symptoms are worse after 24 hours, your health care provider will likely prescribe an antibiotic medicine to treat the mastitis. He or she will determine which bacteria are most likely causing the infection and will then select an appropriate antibiotic medicine. This is sometimes changed based on the results of tests performed to identify the bacteria, or if there is no response to the antibiotic medicine selected. Antibiotic medicines are usually given by mouth. You may also be given medicine for pain. °HOME CARE INSTRUCTIONS °· Only take over-the-counter or prescription medicines for pain, fever, or discomfort as directed by your health care provider. °· If your health care provider prescribed an antibiotic medicine, take the medicine as directed. Make sure you finish it even if you start to feel better. °· Do not wear a tight or underwire bra. Wear a soft, supportive bra. °· Increase your fluid intake, especially if you have a fever. °· Continue to empty the breast. Your health care provider can tell you whether this milk is safe for your infant or needs to be thrown out. You may be told to stop nursing until your health care provider thinks it is safe for your baby. Use a breast pump if you are advised to stop nursing. °· Keep your nipples   clean and dry. °· Empty the first breast completely before going to the other breast. If your baby is not emptying your breasts completely for some reason, use a breast pump to empty your breasts. °· If you go back to work, pump your breasts while at work to stay in time with your nursing schedule. °· Avoid allowing your breasts to become overly filled with milk (engorged). °SEEK MEDICAL CARE IF: °· You have pus-like discharge from the breast. °· Your symptoms do not improve with  the treatment prescribed by your health care provider within 2 days. °SEEK IMMEDIATE MEDICAL CARE IF: °· Your pain and swelling are getting worse. °· You have pain that is not controlled with medicine. °· You have a red line extending from the breast toward your armpit. °· You have a fever or persistent symptoms for more than 2-3 days. °· You have a fever and your symptoms suddenly get worse. °MAKE SURE YOU:  °· Understand these instructions. °· Will watch your condition. °· Will get help right away if you are not doing well or get worse. °Document Released: 11/25/2004 Document Revised: 08/05/2013 Document Reviewed: 03/06/2013 °[employer]® Patient Information ©2015 [employer]. This information is not intended to replace advice given to you by your health care provider. Make sure you discuss any questions you have with your health care provider. °Breastfeeding °Deciding to breastfeed is one of the best choices you can make for you and your baby. A change in hormones during pregnancy causes your breast tissue to grow and increases the number and size of your milk ducts. These hormones also allow proteins, sugars, and fats from your blood supply to make breast milk in your milk-producing glands. Hormones prevent breast milk from being released before your baby is born as well as prompt milk flow after birth. Once breastfeeding has begun, thoughts of your baby, as well as his or her sucking or crying, can stimulate the release of milk from your milk-producing glands.  °BENEFITS OF BREASTFEEDING °For Your Baby °· Your first milk (colostrum) helps your baby's digestive system function better.   °· There are antibodies in your milk that help your baby fight off infections.   °· Your baby has a lower incidence of asthma, allergies, and sudden infant death syndrome.   °· The nutrients in breast milk are better for your baby than infant formulas and are designed uniquely for your baby's needs.   °· Breast milk improves your  baby's brain development.   °· Your baby is less likely to develop other conditions, such as childhood obesity, asthma, or type 2 diabetes mellitus.   °For You  °· Breastfeeding helps to create a very special bond between you and your baby.   °· Breastfeeding is convenient. Breast milk is always available at the correct temperature and costs nothing.   °· Breastfeeding helps to burn calories and helps you lose the weight gained during pregnancy.   °· Breastfeeding makes your uterus contract to its prepregnancy size faster and slows bleeding (lochia) after you give birth.   °· Breastfeeding helps to lower your risk of developing type 2 diabetes mellitus, osteoporosis, and breast or ovarian cancer later in life. °SIGNS THAT YOUR BABY IS HUNGRY °Early Signs of Hunger  °· Increased alertness or activity. °· Stretching. °· Movement of the head from side to side. °· Movement of the head and opening of the mouth when the corner of the mouth or cheek is stroked (rooting). °· Increased sucking sounds, smacking lips, cooing, sighing, or squeaking. °· Hand-to-mouth movements. °· Increased sucking of   fingers or hands. °Late Signs of Hunger °· Fussing. °· Intermittent crying. °Extreme Signs of Hunger °Signs of extreme hunger will require calming and consoling before your baby will be able to breastfeed successfully. Do not wait for the following signs of extreme hunger to occur before you initiate breastfeeding:   °· Restlessness. °· A loud, strong cry. °·  Screaming. °BREASTFEEDING BASICS °Breastfeeding Initiation °· Find a comfortable place to sit or lie down, with your neck and back well supported. °· Place a pillow or rolled up blanket under your baby to bring him or her to the level of your breast (if you are seated). Nursing pillows are specially designed to help support your arms and your baby while you breastfeed. °· Make sure that your baby's abdomen is facing your abdomen.   °· Gently massage your breast. With your  fingertips, massage from your chest wall toward your nipple in a circular motion. This encourages milk flow. You may need to continue this action during the feeding if your milk flows slowly. °· Support your breast with 4 fingers underneath and your thumb above your nipple. Make sure your fingers are well away from your nipple and your baby's mouth.   °· Stroke your baby's lips gently with your finger or nipple.   °· When your baby's mouth is open wide enough, quickly bring your baby to your breast, placing your entire nipple and as much of the colored area around your nipple (areola) as possible into your baby's mouth.   °¨ More areola should be visible above your baby's upper lip than below the lower lip.   °¨ Your baby's tongue should be between his or her lower gum and your breast.   °· Ensure that your baby's mouth is correctly positioned around your nipple (latched). Your baby's lips should create a seal on your breast and be turned out (everted). °· It is common for your baby to suck about 2-3 minutes in order to start the flow of breast milk. °Latching °Teaching your baby how to latch on to your breast properly is very important. An improper latch can cause nipple pain and decreased milk supply for you and poor weight gain in your baby. Also, if your baby is not latched onto your nipple properly, he or she may swallow some air during feeding. This can make your baby fussy. Burping your baby when you switch breasts during the feeding can help to get rid of the air. However, teaching your baby to latch on properly is still the best way to prevent fussiness from swallowing air while breastfeeding. °Signs that your baby has successfully latched on to your nipple:    °· Silent tugging or silent sucking, without causing you pain.   °· Swallowing heard between every 3-4 sucks.   °·  Muscle movement above and in front of his or her ears while sucking.   °Signs that your baby has not successfully latched on to  nipple:  °· Sucking sounds or smacking sounds from your baby while breastfeeding. °· Nipple pain. °If you think your baby has not latched on correctly, slip your finger into the corner of your baby's mouth to break the suction and place it between your baby's gums. Attempt breastfeeding initiation again. °Signs of Successful Breastfeeding °Signs from your baby:   °· A gradual decrease in the number of sucks or complete cessation of sucking.   °· Falling asleep.   °· Relaxation of his or her body.   °· Retention of a small amount of milk in his or her mouth.   °· Letting go   of your breast by himself or herself. °Signs from you: °· Breasts that have increased in firmness, weight, and size 1-3 hours after feeding.   °· Breasts that are softer immediately after breastfeeding. °· Increased milk volume, as well as a change in milk consistency and color by the fifth day of breastfeeding.   °· Nipples that are not sore, cracked, or bleeding. °Signs That Your Baby is Getting Enough Milk °· Wetting at least 3 diapers in a 24-hour period. The urine should be clear and pale yellow by age 5 days. °· At least 3 stools in a 24-hour period by age 5 days. The stool should be soft and yellow. °· At least 3 stools in a 24-hour period by age 7 days. The stool should be seedy and yellow. °· No loss of weight greater than 10% of birth weight during the first 3 days of age. °· Average weight gain of 4-7 ounces (113-198 g) per week after age 4 days. °· Consistent daily weight gain by age 5 days, without weight loss after the age of 2 weeks. °After a feeding, your baby may spit up a small amount. This is common. °BREASTFEEDING FREQUENCY AND DURATION °Frequent feeding will help you make more milk and can prevent sore nipples and breast engorgement. Breastfeed when you feel the need to reduce the fullness of your breasts or when your baby shows signs of hunger. This is called "breastfeeding on demand." Avoid introducing a pacifier to your  baby while you are working to establish breastfeeding (the first 4-6 weeks after your baby is born). After this time you may choose to use a pacifier. Research has shown that pacifier use during the first year of a baby's life decreases the risk of sudden infant death syndrome (SIDS). °Allow your baby to feed on each breast as long as he or she wants. Breastfeed until your baby is finished feeding. When your baby unlatches or falls asleep while feeding from the first breast, offer the second breast. Because newborns are often sleepy in the first few weeks of life, you may need to awaken your baby to get him or her to feed. °Breastfeeding times will vary from baby to baby. However, the following rules can serve as a guide to help you ensure that your baby is properly fed: °· Newborns (babies 4 weeks of age or younger) may breastfeed every 1-3 hours. °· Newborns should not go longer than 3 hours during the day or 5 hours during the night without breastfeeding. °· You should breastfeed your baby a minimum of 8 times in a 24-hour period until you begin to introduce solid foods to your baby at around 6 months of age. °BREAST MILK PUMPING °Pumping and storing breast milk allows you to ensure that your baby is exclusively fed your breast milk, even at times when you are unable to breastfeed. This is especially important if you are going back to work while you are still breastfeeding or when you are not able to be present during feedings. Your lactation consultant can give you guidelines on how long it is safe to store breast milk.  °A breast pump is a machine that allows you to pump milk from your breast into a sterile bottle. The pumped breast milk can then be stored in a refrigerator or freezer. Some breast pumps are operated by hand, while others use electricity. Ask your lactation consultant which type will work best for you. Breast pumps can be purchased, but some hospitals and breastfeeding support groups   lease  breast pumps on a monthly basis. A lactation consultant can teach you how to hand express breast milk, if you prefer not to use a pump.  °CARING FOR YOUR BREASTS WHILE YOU BREASTFEED °Nipples can become dry, cracked, and sore while breastfeeding. The following recommendations can help keep your breasts moisturized and healthy: °· Avoid using soap on your nipples.   °· Wear a supportive bra. Although not required, special nursing bras and tank tops are designed to allow access to your breasts for breastfeeding without taking off your entire bra or top. Avoid wearing underwire-style bras or extremely tight bras. °· Air dry your nipples for 3-4 minutes after each feeding.   °· Use only cotton bra pads to absorb leaked breast milk. Leaking of breast milk between feedings is normal.   °· Use lanolin on your nipples after breastfeeding. Lanolin helps to maintain your skin's normal moisture barrier. If you use pure lanolin, you do not need to wash it off before feeding your baby again. Pure lanolin is not toxic to your baby. You may also hand express a few drops of breast milk and gently massage that milk into your nipples and allow the milk to air dry. °In the first few weeks after giving birth, some women experience extremely full breasts (engorgement). Engorgement can make your breasts feel heavy, warm, and tender to the touch. Engorgement peaks within 3-5 days after you give birth. The following recommendations can help ease engorgement: °· Completely empty your breasts while breastfeeding or pumping. You may want to start by applying warm, moist heat (in the shower or with warm water-soaked hand towels) just before feeding or pumping. This increases circulation and helps the milk flow. If your baby does not completely empty your breasts while breastfeeding, pump any extra milk after he or she is finished. °· Wear a snug bra (nursing or regular) or tank top for 1-2 days to signal your body to slightly decrease milk  production. °· Apply ice packs to your breasts, unless this is too uncomfortable for you. °· Make sure that your baby is latched on and positioned properly while breastfeeding. °If engorgement persists after 48 hours of following these recommendations, contact your health care provider or a lactation consultant. °OVERALL HEALTH CARE RECOMMENDATIONS WHILE BREASTFEEDING °· Eat healthy foods. Alternate between meals and snacks, eating 3 of each per day. Because what you eat affects your breast milk, some of the foods may make your baby more irritable than usual. Avoid eating these foods if you are sure that they are negatively affecting your baby. °· Drink milk, fruit juice, and water to satisfy your thirst (about 10 glasses a day).   °· Rest often, relax, and continue to take your prenatal vitamins to prevent fatigue, stress, and anemia. °· Continue breast self-awareness checks. °· Avoid chewing and smoking tobacco. °· Avoid alcohol and drug use. °Some medicines that may be harmful to your baby can pass through breast milk. It is important to ask your health care provider before taking any medicine, including all over-the-counter and prescription medicine as well as vitamin and herbal supplements. °It is possible to become pregnant while breastfeeding. If birth control is desired, ask your health care provider about options that will be safe for your baby. °SEEK MEDICAL CARE IF:  °· You feel like you want to stop breastfeeding or have become frustrated with breastfeeding. °· You have painful breasts or nipples. °· Your nipples are cracked or bleeding. °· Your breasts are red, tender, or warm. °· You have   a swollen area on either breast. °· You have a fever or chills. °· You have nausea or vomiting. °· You have drainage other than breast milk from your nipples. °· Your breasts do not become full before feedings by the fifth day after you give birth. °· You feel sad and depressed. °· Your baby is too sleepy to eat  well. °· Your baby is having trouble sleeping.   °· Your baby is wetting less than 3 diapers in a 24-hour period. °· Your baby has less than 3 stools in a 24-hour period. °· Your baby's skin or the white part of his or her eyes becomes yellow.   °· Your baby is not gaining weight by 5 days of age. °SEEK IMMEDIATE MEDICAL CARE IF:  °· Your baby is overly tired (lethargic) and does not want to wake up and feed. °· Your baby develops an unexplained fever. °Document Released: 07/31/2005 Document Revised: 08/05/2013 Document Reviewed: 01/22/2013 °[employer]® Patient Information ©2015 [employer]. This information is not intended to replace advice given to you by your health care provider. Make sure you discuss any questions you have with your health care provider. ° °

## 2014-05-09 NOTE — Lactation Note (Signed)
This note was copied from the chart of Meghan Ree Alcalde. Lactation Consultation Note  Patient Name: Meghan Mcmahon Date: 05/09/2014 Reason for consult: Follow-up assessment  Assisted with achieving depth.  Infant is 11 hrs old; only 4% weight loss; breastfed x5 (10-15 min) + 16 (3-7 min) feeds in past 24 hrs; voids -8 in 24 hrs/ 13 life; stools - 6 in 24 hrs/ 10 life.  Bili levels are 13.1 @ 50 hrs old in high intermediate zone but below level for well-term infant phototherapy guide.  Mom was latching infant upon LC visit.  LC observed a shallow latch.  LC assisted with achieving depth; taught mom asymmetrical latching technique.  Infant achieved a wide mouth with flanged lips; LC did lots teaching with both parents for how to best achieve this wide latch.  Latch-on 1,2,3 Sheet given.  Mom is experiences 18-19 months with previous 2 yr old child.  Mom reports milk is in; slightly tender nipples but mom has comfort gels.  Dad concerned about amount infant is spitting-up; discussed strategies to help with spit-up and assuring infant receives hind-milk with feedings.  Discussed bili levels and effects of sleepiness with breastfeeding.  Encouraged lots of breastfeeding and sunshine on nice days.  Lots teaching done.  Dad was reassured by end of discussion.  Has Hand pump for home use; engorgement prevention discussed.  Informed of outpatient services and hospital support group.  Encouraged to call for questions after discharge if needed.    Maternal Data    Feeding Feeding Type: Breast Fed Length of feed: 15 min  LATCH Score/Interventions Latch: Grasps breast easily, tongue down, lips flanged, rhythmical sucking.  Audible Swallowing: Spontaneous and intermittent Intervention(s): Skin to skin  Type of Nipple: Everted at rest and after stimulation  Comfort (Breast/Nipple): Filling, red/small blisters or bruises, mild/mod discomfort  Problem noted: Mild/Moderate discomfort Interventions  (Filling): Firm support Interventions (Mild/moderate discomfort): Comfort gels  Hold (Positioning): Assistance needed to correctly position infant at breast and maintain latch. (assisted with achieving depth) Intervention(s): Skin to skin  LATCH Score: 8  Lactation Tools Discussed/Used     Consult Status Consult Status: Complete    Lendon Ka 05/09/2014, 11:31 AM

## 2014-05-09 NOTE — Discharge Summary (Signed)
Obstetric Discharge Summary  Reason for Admission: Pt is a G3P2012 at [redacted]w[redacted]d IOL for polyhydramnios  Patient has received care at [facility] since 16.4 wks, with Dr. Ernestina Penna as primary provider.  Medications on Admission: Prescriptions prior to admission  Medication Sig Dispense Refill  . Prenatal Vit-Fe Fumarate-FA (PRENATAL MULTIVITAMIN) TABS tablet Take 1 tablet by mouth daily at 12 noon.        Intrapartum Course:  Admitted for IOL d/t polyhydramnios / AROM with clear fluid / normal progression to complete dilation / SVD of viable female with 2nd degree repair by Dr. Ernestina Penna / no postpartum complications noted   Prenatal Labs: ABO, Rh: --/--/O POS (09/23 1400) Antibody: NEG (09/23 1400) Rubella: Immune (04/10 0000)   RPR: NON REAC (09/23 1400)  HBsAg: Negative (04/10 0000)  HIV: Non-reactive (04/10 0000)  GTT : normal GBS: Negative (08/18 0000)   Prenatal Procedures: NST and ultrasound Intrapartum Procedures: spontaneous vaginal delivery Postpartum Procedures: none Complications-Operative and Postpartum: 2nd degree perineal laceration  Labs: Hemoglobin  Date Value Ref Range Status  05/07/2014 12.5  12.0 - 15.0 g/dL Final     HCT  Date Value Ref Range Status  05/07/2014 36.5  36.0 - 46.0 % Final   Lab Results  Component Value Date   PLT 113* 05/07/2014    Newborn Data: Live born female  Birth Weight: 7 lb 13.8 oz (3566 g) APGAR: 8, 9  Home with mother.   Discharge Information: Date: 05/09/2014 Discharge Diagnoses:  Pt is a [id number] at [redacted]w[redacted]d S/P Post-date pregnancy on 05/07/2014  Condition: stable Activity: pelvic rest Diet: routine Medications:    Medication List         ibuprofen 600 MG tablet  Commonly known as:  ADVIL,MOTRIN  Take 1 tablet (600 mg total) by mouth every 6 (six) hours.     oxyCODONE-acetaminophen 5-325 MG per tablet  Commonly known as:  PERCOCET/ROXICET  Take 1 tablet by mouth every 4 (four) hours as needed (for pain scale less  than 7).     prenatal multivitamin Tabs tablet  Take 1 tablet by mouth daily at 12 noon.       Instructions: The [facility] OB[facility] instruction booklet has been given and reviewed Discharge to: home     Follow-up Information   Follow up with [facility] A., MD. Schedule an appointment as soon as possible for a visit in 6 weeks. (postpartum visit)    Specialty:  Obstetrics and Gynecology   Contact information:   [address] [place] [address] [phone]       Raelyn Mora, Judie Petit, MSN, CNM 05/09/2014, 8:28 AM

## 2014-05-11 ENCOUNTER — Inpatient Hospital Stay (HOSPITAL_COMMUNITY)
Admission: AD | Admit: 2014-05-11 | Discharge: 2014-05-12 | Disposition: A | Discharge status: Home / Self Care | Payer: 59 | Source: Ambulatory Visit | Attending: Obstetrics | Admitting: Obstetrics

## 2014-05-11 ENCOUNTER — Encounter (HOSPITAL_COMMUNITY): Payer: Self-pay | Admitting: *Deleted

## 2014-05-11 ENCOUNTER — Inpatient Hospital Stay (HOSPITAL_COMMUNITY): Admission: RE | Admit: 2014-05-11 | Payer: 59 | Source: Ambulatory Visit

## 2014-05-11 DIAGNOSIS — N949 Unspecified condition associated with female genital organs and menstrual cycle: Secondary | ICD-10-CM | POA: Insufficient documentation

## 2014-05-11 DIAGNOSIS — O864 Pyrexia of unknown origin following delivery: Secondary | ICD-10-CM | POA: Diagnosis not present

## 2014-05-11 DIAGNOSIS — O99893 Other specified diseases and conditions complicating puerperium: Secondary | ICD-10-CM | POA: Insufficient documentation

## 2014-05-11 DIAGNOSIS — O9989 Other specified diseases and conditions complicating pregnancy, childbirth and the puerperium: Principal | ICD-10-CM

## 2014-05-11 LAB — URINE MICROSCOPIC-ADD ON

## 2014-05-11 LAB — CBC
HCT: 37.6 % (ref 36.0–46.0)
HEMOGLOBIN: 12.8 g/dL (ref 12.0–15.0)
MCH: 31.2 pg (ref 26.0–34.0)
MCHC: 34 g/dL (ref 30.0–36.0)
MCV: 91.7 fL (ref 78.0–100.0)
PLATELETS: 154 10*3/uL (ref 150–400)
RBC: 4.1 MIL/uL (ref 3.87–5.11)
RDW: 13.3 % (ref 11.5–15.5)
WBC: 14.3 10*3/uL — AB (ref 4.0–10.5)

## 2014-05-11 LAB — URINALYSIS, ROUTINE W REFLEX MICROSCOPIC
Bilirubin Urine: NEGATIVE
Glucose, UA: NEGATIVE mg/dL
KETONES UR: NEGATIVE mg/dL
Leukocytes, UA: NEGATIVE
NITRITE: NEGATIVE
PH: 6 (ref 5.0–8.0)
Protein, ur: NEGATIVE mg/dL
Specific Gravity, Urine: 1.01 (ref 1.005–1.030)
Urobilinogen, UA: 0.2 mg/dL (ref 0.0–1.0)

## 2014-05-11 MED ORDER — OXYCODONE-ACETAMINOPHEN 5-325 MG PO TABS: 1.0000 | ORAL_TABLET | ORAL | Status: AC | PRN

## 2014-05-11 MED ORDER — CEFAZOLIN SODIUM-DEXTROSE 2-3 GM-% IV SOLR
2.0000 g | Freq: Once | INTRAVENOUS | Status: AC
  Administered 2014-05-12: 2 g via INTRAVENOUS
  Filled 2014-05-11: qty 50

## 2014-05-11 MED ORDER — CEPHALEXIN 500 MG PO CAPS: 500.0000 mg | ORAL_CAPSULE | Freq: Two times a day (BID) | ORAL | Status: AC

## 2014-05-11 MED ORDER — SODIUM CHLORIDE 0.9 % IV BOLUS (SEPSIS)
1000.0000 mL | Freq: Once | INTRAVENOUS | Status: AC
  Administered 2014-05-11: 1000 mL via INTRAVENOUS

## 2014-05-11 NOTE — H&P (Signed)
CC: This is a 28 year old G3 P2 0125 days postpartum from spontaneous vaginal delivery with a second-degree perineal tear and labial tear and right periurethral tear. Patient is presenting for increased vaginal pressure and fever at home to 103. Patient notes vaginal pain and pressure since delivery which has been relieved with as needed Percocet and scheduled ibuprofen. Initially patient was just taking Percocet as needed and would require Percocet several times per day. Starting earlier today patient began taking Percocet were scheduled and noticed this has drastically improved her pelvic pressure and pain. Patient had been doing sitz bath but had not done one today. Patient also notes last bowel movement was on Saturday.  Patient notes slightly improved vaginal pain and pressure today but started noticing chills at 6 PM tonight. After that patient started feeling achy all over and around 7 PM took her temperature and was 101. About an hour later her temperature had risen to 103. At that point patient followed instructions previously given to her by the midwife to take some ibuprofen. Phone triage was initially concerning for mastoiditis and dicloxacillin was called into the pharmacy. Patient requested to speak to me for further full evaluation. Given that patient was not complaining of significant breast pain to the and was more concerned of her pelvic pressure I asked patient to present for evaluation.  After arrival to [facility] patient had started on 1 L of IV fluid. Patient notes overall feeling a little bit better with decreased aches and pains. Patient notes no chest pain and no shortness of breath although she does note that every once in a while while at rest she feels like it is momentarily hard to catch her breath. Patient notes scant thin vaginal bleeding without clots. Patient notes no dysuria or flank pain. Patient notes no right upper quadrant pain or uterine or periumbilical pain. Patient notes no  nausea and vomiting.  Patient notes poor sleep due to infant who is feeding well and gaining weight. Patient notes adequate milk supply. Patient denies redness warmth or breast pain.  PMH: SVD x 2 All: NKDA Meds: Percocet, ibuprofen, Colace  PE: Filed Vitals:   05/11/14 2254  Pulse: 80  Resp: 16    general appearance: tired, weepy, no acute distress Breasts: Symmetric, no masses, no erythema, no warmth, no plugged ducts Lymphatic: No axillary or cervical lymphadenopathy, some prominent bilateral inguinal lymph nodes with no significant adenopathy, minimal tenderness Cardiovascular: Regular rate and rhythm Pulmonary: Clear to auscultation bilaterally Back: No costovertebral angle tenderness Abdomen: No right upper quadrant pain, soft, nontender, nondistended, fundus well below the umbilicus with no fundal tenderness GU: Cervix closed, no cervical motion tenderness, no adnexal masses, no adnexal tenderness, no uterine tenderness, small amount of thin blood in the vagina consistent with normal lochia, patent appearing urethra with intact periurethral sutures not constricting the urethral meatus, intact obstetrical laceration with appropriate healing, some scant yellowish exudate over the fairly well approximated external perineal closure, right perineum just inside the introitus significantly tender with a 4 x 2 cm indurated area extending to the labia. No discharge or fluctuant. Lower extremity: Nontender, no edema, no erythema, no warmth, no cords  CBC    Component Value Date/Time   WBC 14.3* 05/11/2014 2243   RBC 4.10 05/11/2014 2243   HGB 12.8 05/11/2014 2243   HCT 37.6 05/11/2014 2243   PLT 154 05/11/2014 2243   MCV 91.7 05/11/2014 2243   MCH 31.2 05/11/2014 2243   MCHC 34.0 05/11/2014 2243   RDW  13.3 05/11/2014 2243    Assessment and plan: 28 year old she 3 AP to 5 days postpartum from spontaneous vaginal delivery with second-degree laceration now presenting with fever and pelvic pain  most likely related to a hematoma. Clinically stable, hemodynamically stable, hemoglobin stable from discharge, white blood cell count lower than discharge. No evidence of endometritis or mastitis, no evidence of pyelonephritis. Urine culture sent. Patient has been hydrated with 1 L of normal saline. Patient has not been febrile and has stable vital signs through her MAU evaluation. While she does exhibit some tenderness in the vaginal wall that is most likely consistent with hematoma, I do not see any reason for urgent surgical evacuation. Overall patient states the pain is improving with more consistent pain medication use. Infection of the hematoma with a skin bacteria possible and will cover with Keflex as the patient is breast-feeding. Also recommends a sitz bath 2-3 times per day. Patient aware of warning signs of worsening fever heavy vaginal bleeding worsening pain and she presents with those symptoms. Otherwise he'll follow the patient back up in the office in 2 weeks' time.  Kollins Fenter A. 05/11/2014 11:48 PM

## 2014-05-13 LAB — URINE CULTURE

## 2014-06-15 ENCOUNTER — Encounter (HOSPITAL_COMMUNITY): Payer: Self-pay | Admitting: *Deleted

## 2014-12-13 IMAGING — US US OB TRANSVAGINAL
1 series · 14 of 28 positions shown · non-contrast
Comparison: None.

CLINICAL DATA: Cramping, pregnant.

EXAM:
OBSTETRIC <14 WK US AND TRANSVAGINAL OB US
TECHNIQUE: Both transabdominal and transvaginal ultrasound examinations were
performed for complete evaluation of the gestation as well as the
maternal uterus, adnexal regions, and pelvic cul-de-sac.
Transvaginal technique was performed to assess early pregnancy.

[Series 1: us ob comp less 14 wks · 14 of 44 slices shown]
[im 2/44]
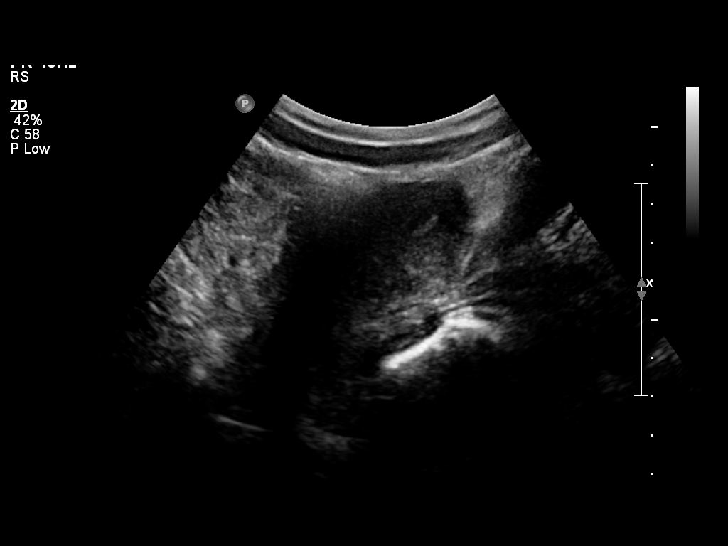
[im 5/44]
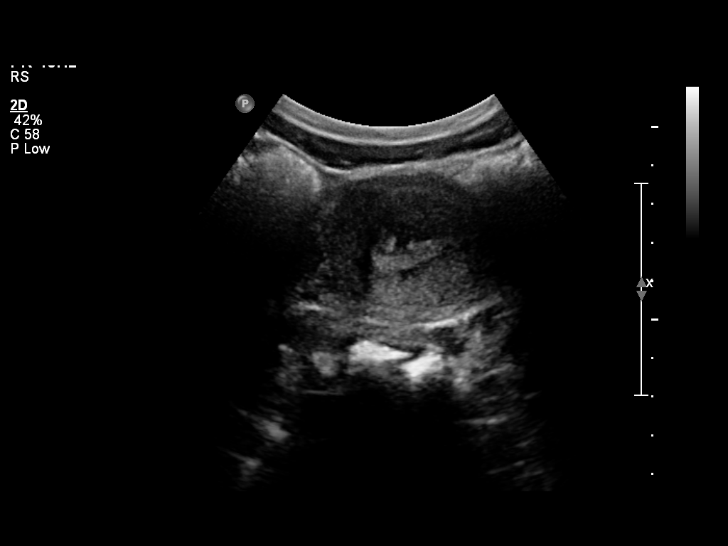
[im 8/44]
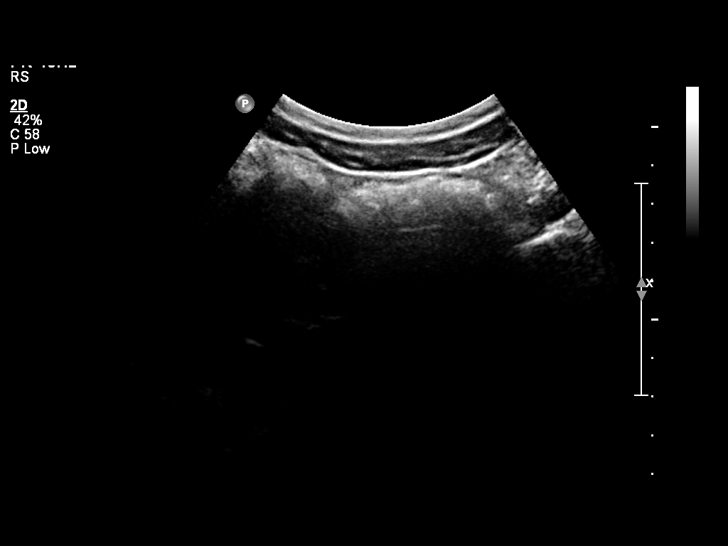
[im 12/44]
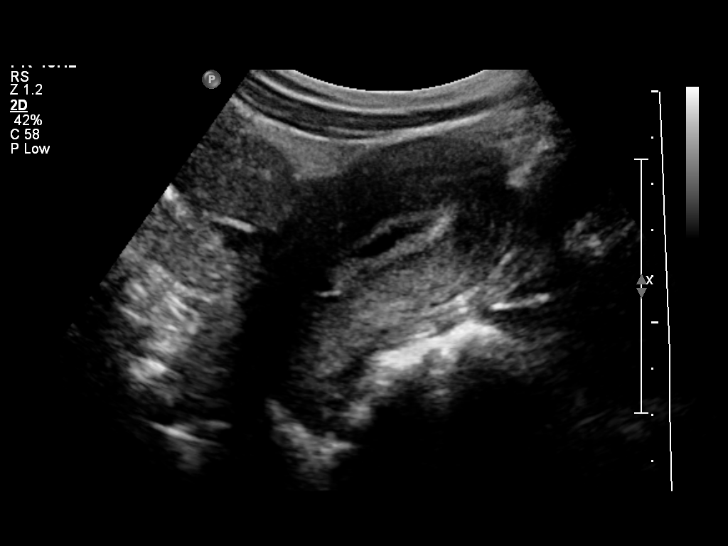
[im 15/44]
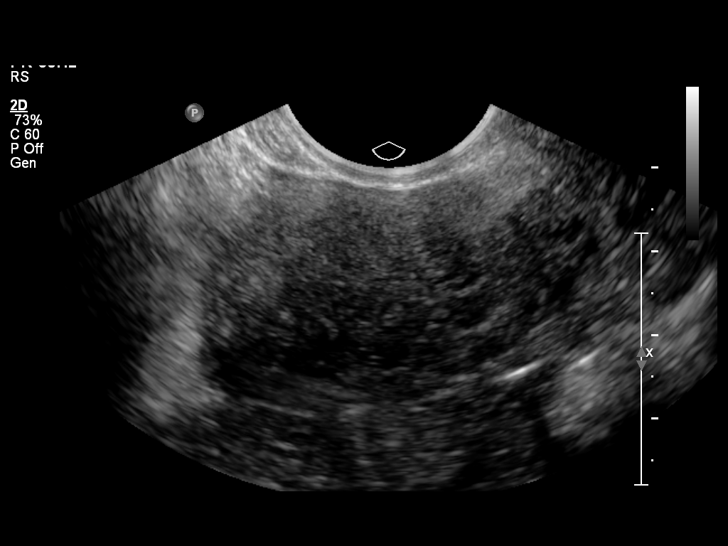
[im 18/44]
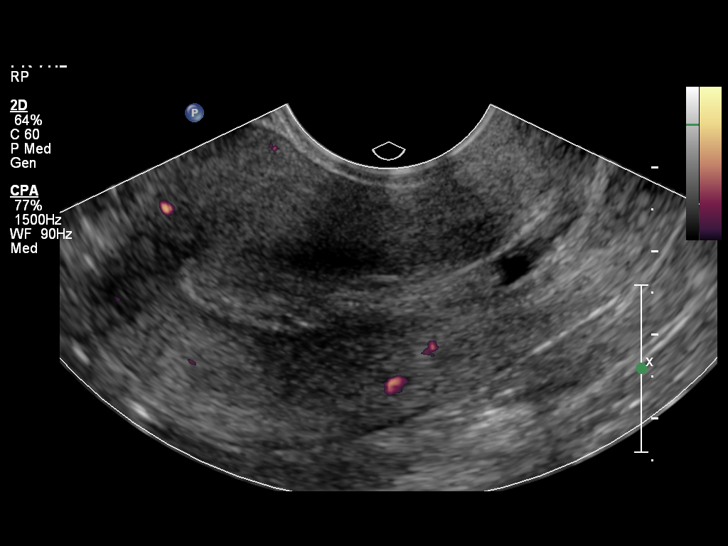
[im 21/44]
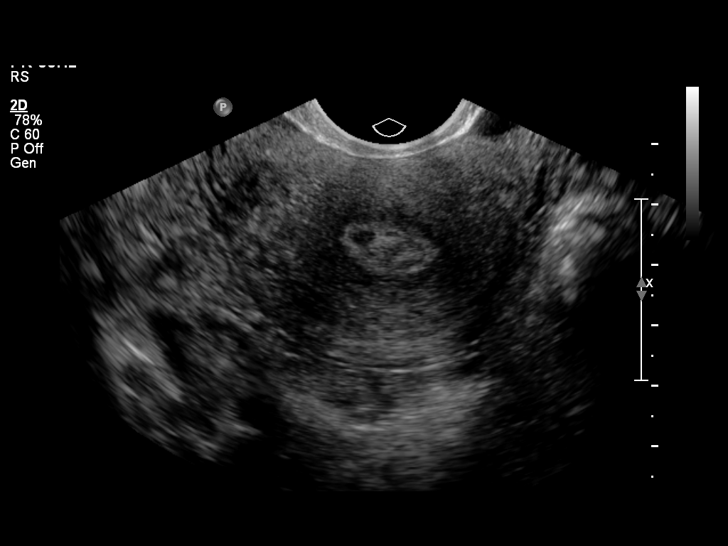
[im 24/44]
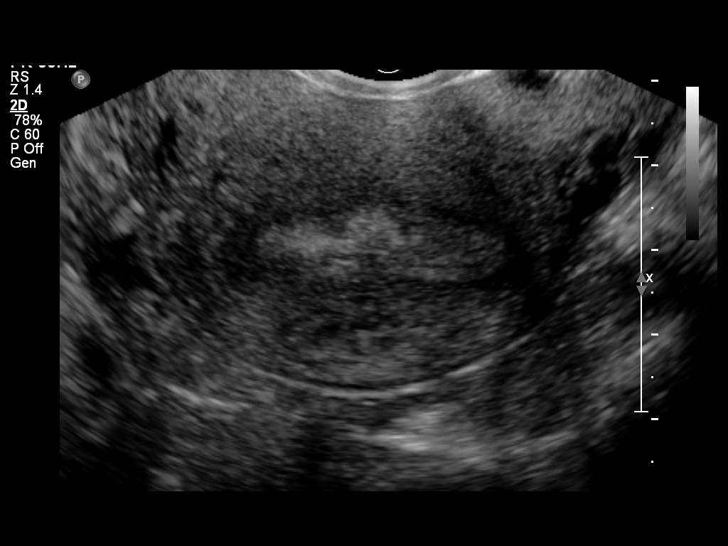
[im 28/44]
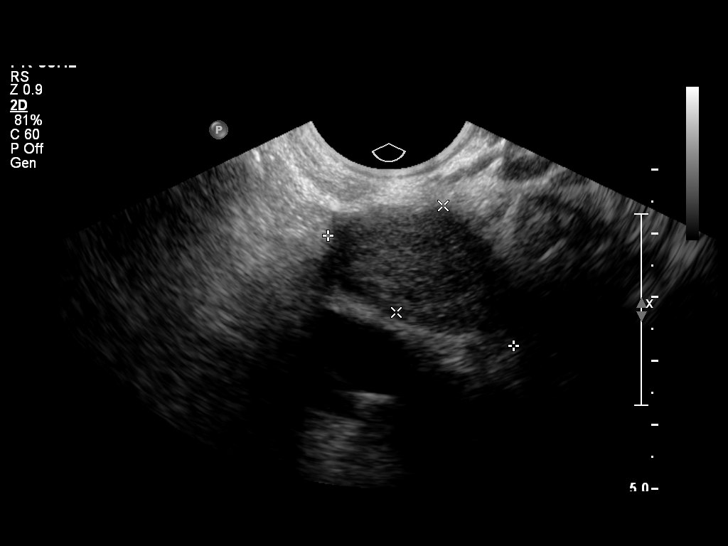
[im 31/44]
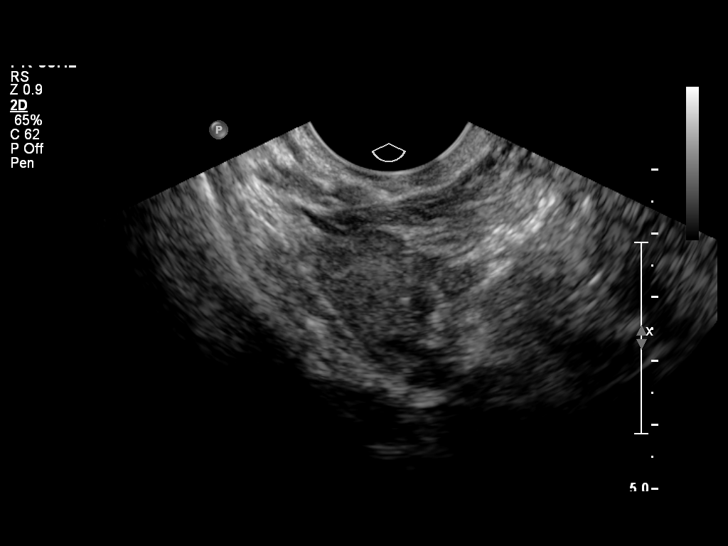
[im 34/44]
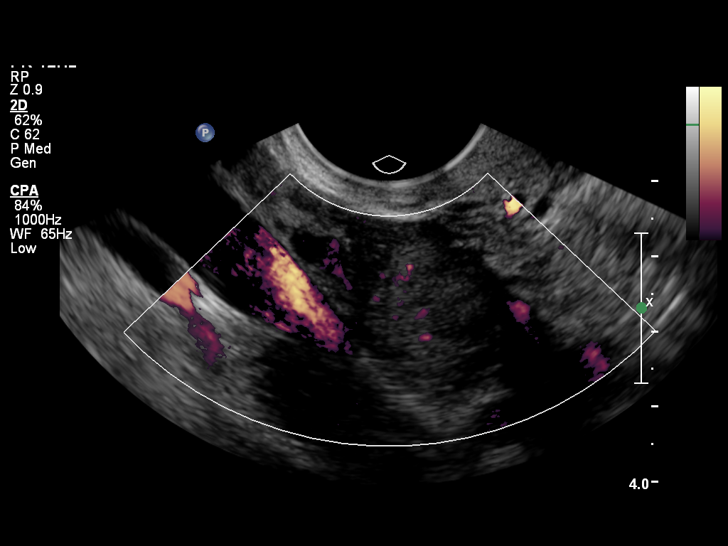
[im 37/44]
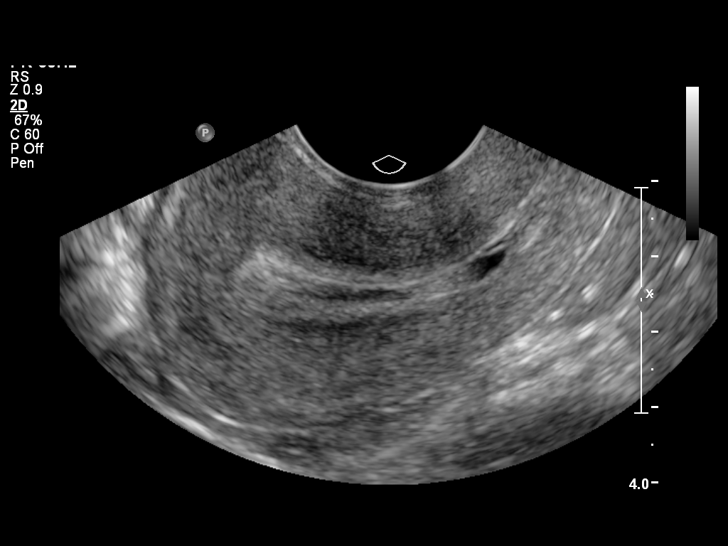
[im 40/44]
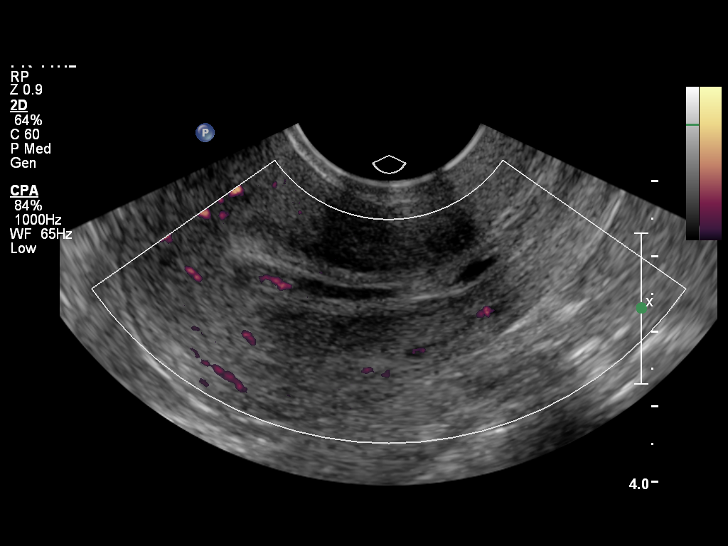
[im 44/44]
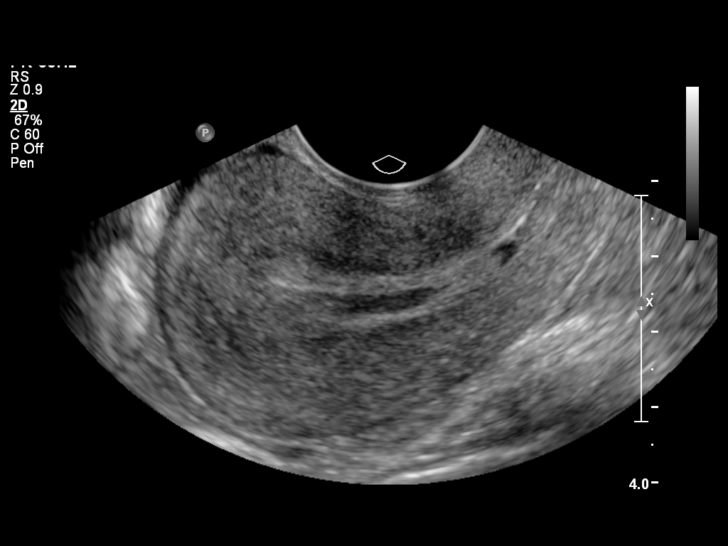

[14 of 28 positions shown; findings below may reference images not displayed]

FINDINGS: Intrauterine gestational sac: None seen

Yolk sac:  None seen

Embryo:  None seen

Maternal uterus/adnexae: Left ovary 18 x 34 mm, right 19 x 30 mm. No
free fluid. Uterus unremarkable. Heterogeneous endometrium.
IMPRESSION: No evidence of intrauterine or ectopic gestation.
